# Patient Record
Sex: Male | Born: 1980
Health system: Southern US, Community
[De-identification: ages and names within clinical notes are randomized; demographics above are authoritative.]

## PROBLEM LIST (undated history)

## (undated) DIAGNOSIS — I471 Supraventricular tachycardia, unspecified: Secondary | ICD-10-CM

## (undated) HISTORY — PX: LEG SURGERY: SHX1003

## (undated) HISTORY — PX: STOMACH SURGERY: SHX791

---

## 2001-10-26 ENCOUNTER — Emergency Department (HOSPITAL_COMMUNITY): Admission: EM | Admit: 2001-10-26 | Discharge: 2001-10-26 | Payer: Self-pay | Admitting: Emergency Medicine

## 2001-10-26 ENCOUNTER — Encounter: Payer: Self-pay | Admitting: Emergency Medicine

## 2002-04-05 ENCOUNTER — Emergency Department (HOSPITAL_COMMUNITY): Admission: EM | Admit: 2002-04-05 | Discharge: 2002-04-05 | Payer: Self-pay | Admitting: Emergency Medicine

## 2003-07-07 ENCOUNTER — Ambulatory Visit: Admission: RE | Admit: 2003-07-07 | Discharge: 2003-07-07 | Payer: Self-pay | Admitting: Family Medicine

## 2003-07-07 ENCOUNTER — Encounter: Payer: Self-pay | Admitting: Cardiology

## 2003-11-13 ENCOUNTER — Emergency Department (HOSPITAL_COMMUNITY): Admission: EM | Admit: 2003-11-13 | Discharge: 2003-11-13 | Payer: Self-pay | Admitting: Emergency Medicine

## 2003-11-19 ENCOUNTER — Emergency Department (HOSPITAL_COMMUNITY): Admission: EM | Admit: 2003-11-19 | Discharge: 2003-11-19 | Payer: Self-pay | Admitting: Emergency Medicine

## 2004-05-31 ENCOUNTER — Ambulatory Visit: Payer: Self-pay | Admitting: Internal Medicine

## 2004-07-24 ENCOUNTER — Ambulatory Visit: Payer: Self-pay | Admitting: Internal Medicine

## 2004-09-04 ENCOUNTER — Ambulatory Visit: Payer: Self-pay | Admitting: Internal Medicine

## 2005-05-05 HISTORY — PX: ELECTROPHYSIOLOGIC STUDY: SHX172A

## 2005-05-09 ENCOUNTER — Ambulatory Visit: Payer: Self-pay | Admitting: Cardiology

## 2005-05-10 ENCOUNTER — Inpatient Hospital Stay (HOSPITAL_COMMUNITY): Admission: EM | Admit: 2005-05-10 | Discharge: 2005-05-14 | Payer: Self-pay | Admitting: Emergency Medicine

## 2005-05-12 ENCOUNTER — Encounter: Payer: Self-pay | Admitting: Cardiology

## 2005-06-18 ENCOUNTER — Ambulatory Visit: Payer: Self-pay | Admitting: Internal Medicine

## 2005-07-21 ENCOUNTER — Ambulatory Visit: Payer: Self-pay | Admitting: Internal Medicine

## 2005-07-22 ENCOUNTER — Ambulatory Visit: Payer: Self-pay | Admitting: Cardiology

## 2005-10-09 ENCOUNTER — Ambulatory Visit: Payer: Self-pay | Admitting: Internal Medicine

## 2005-11-06 ENCOUNTER — Emergency Department (HOSPITAL_COMMUNITY): Admission: EM | Admit: 2005-11-06 | Discharge: 2005-11-07 | Payer: Self-pay | Admitting: Emergency Medicine

## 2006-10-15 ENCOUNTER — Ambulatory Visit: Payer: Self-pay | Admitting: Internal Medicine

## 2007-01-18 ENCOUNTER — Inpatient Hospital Stay (HOSPITAL_COMMUNITY): Admission: EM | Admit: 2007-01-18 | Discharge: 2007-01-19 | Payer: Self-pay | Admitting: Emergency Medicine

## 2007-01-18 ENCOUNTER — Ambulatory Visit: Payer: Self-pay | Admitting: Cardiology

## 2007-02-23 ENCOUNTER — Ambulatory Visit: Payer: Self-pay | Admitting: Internal Medicine

## 2007-10-25 ENCOUNTER — Ambulatory Visit: Payer: Self-pay | Admitting: Internal Medicine

## 2008-10-11 ENCOUNTER — Encounter (INDEPENDENT_AMBULATORY_CARE_PROVIDER_SITE_OTHER): Payer: Self-pay | Admitting: *Deleted

## 2008-11-20 ENCOUNTER — Telehealth: Payer: Self-pay | Admitting: Internal Medicine

## 2008-11-23 DIAGNOSIS — I498 Other specified cardiac arrhythmias: Secondary | ICD-10-CM

## 2008-11-23 DIAGNOSIS — E785 Hyperlipidemia, unspecified: Secondary | ICD-10-CM | POA: Insufficient documentation

## 2008-11-23 DIAGNOSIS — I471 Supraventricular tachycardia, unspecified: Secondary | ICD-10-CM | POA: Insufficient documentation

## 2008-11-23 DIAGNOSIS — R55 Syncope and collapse: Secondary | ICD-10-CM | POA: Insufficient documentation

## 2008-11-23 DIAGNOSIS — J45909 Unspecified asthma, uncomplicated: Secondary | ICD-10-CM

## 2008-11-24 ENCOUNTER — Ambulatory Visit: Payer: Self-pay | Admitting: Internal Medicine

## 2008-11-24 ENCOUNTER — Telehealth: Payer: Self-pay | Admitting: Internal Medicine

## 2010-05-14 ENCOUNTER — Encounter
Admission: RE | Admit: 2010-05-14 | Discharge: 2010-05-14 | Payer: Self-pay | Source: Home / Self Care | Attending: Family Medicine | Admitting: Family Medicine

## 2010-05-25 ENCOUNTER — Encounter: Payer: Self-pay | Admitting: Family Medicine

## 2010-05-28 ENCOUNTER — Encounter
Admission: RE | Admit: 2010-05-28 | Discharge: 2010-05-28 | Payer: Self-pay | Source: Home / Self Care | Attending: Family Medicine | Admitting: Family Medicine

## 2010-09-17 NOTE — Discharge Summary (Signed)
NAMEJUANITO, GONYER NO.:  [id number]   MEDICAL RECORD NO.:  [id number]          PATIENT TYPE:  INP   LOCATION:  3705                         FACILITY:  [facility]   PHYSICIAN:  Doylene Canning. Ladona Ridgel, MD    DATE OF BIRTH:  1980-09-02   DATE OF ADMISSION:  01/17/2007  DATE OF DISCHARGE:  01/19/2007                               DISCHARGE SUMMARY   ALLERGIES:  No known drug allergies.   DISCHARGING TIME:  Greater than 35 minutes.   FINAL DIAGNOSES:  1. Admitted with syncope in setting of recurrent intra-nodal reentry      accessory pathway tachy palpitation.  2. History of tachy palpitations.  Electrophysiology study January      2007 (accessory pathway origin HIS bundle both AV-VA dissociation:      No ablation secondary to intimacy of accessory pathway with AV      node).   SECONDARY DIAGNOSES:  1. Reentry tachycardia failed beta blockade, Rythmol, now on      flecainide. (Flecainide increased to 100 mg b.i.d. this admission.)  2. Echocardiogram:  Ejection fraction 55-65%.  3. Troponin I studies negative so far.  4. Asthma.  5. Dyslipidemia.   PROCEDURES:  No procedures this admission. His flecainide, which was 50  mg b.i.d., has been increased to 100 mg b.i.d.  The patient has no  further tachy arrhythmias.   BRIEF HISTORY:  Mr. Trompeter is a 30 year old male.  He has a history of  nodal fascicular tachycardia. He presents with one episode of syncope  and palpitation.   The patient has been treated for SVT with beta blockers, but they  failed.  Then he had a trial with Rythmol but subsequently achieved good  repression of the arrhythmia with flecainide.   In the last 12 hours, the patient has complained of headache and some  mild upper respiratory illness-type symptoms. In the evening while lying  down, the patient noticed acute exacerbation of palpitations with heart  rate ranging over 200 beats a minute.  He stood up and attempted to walk  down the hall in his  house and subsequently lost consciousness.  The  patient had no significant trauma to the head. The patient immediately  awoke on falling to the floor and noted spontaneous resolution of the  tachycardia.  He presented to the emergency room for further management.  The patient did not have chest pain and did not have any exertional  dyspnea.  No orthopnea, no paroxysmal nocturnal dyspnea.   The plan is to admit the patient with recurrence of supraventricular  tachycardia. Cardiac enzymes will be cycled to rule out acute myocardial  infarction.  He will continue on his home medications which includes  inhalers for asthma, and flecainide will be increased to 100 mg twice  daily.   HOSPITAL COURSE:  The patient presents on September 15 with an episode  of syncope and palpitation. The patient's dose of flecainide was  increased to 100 mg b.i.d.  He is had no recurrence and discharging  hospital day #2, September 16, on the increased dose.  His medications  now  include:  1. Flecainide 100 mg twice daily.  2. Flovent 110 mcg per inhalation 2 puffs daily.  3. Qvar inhaler 1 puff daily.  4. Albuterol inhaler as needed.   He is to call Dr. Lubertha Basque office at [phone] for an appointment in 3-4  weeks.   LABORATORY STUDIES:  Complete blood count September 15:  White cells  6.7, hemoglobin 14.1, hematocrit 42.5, platelets 164.  Serum  electrolytes:  Sodium 139, potassium 4.1, chloride 108, carbonate 26,  glucose 99, BUN 13, creatinine 1.16.  Troponin I studies are 0.01 and  then 0.02.  TSH is 1.283.      [name], PA      Doylene Canning. Ladona Ridgel, MD  Electronically Signed    GM/MEDQ  D:  01/19/2007  T:  01/19/2007  Job:  [id number]

## 2010-09-17 NOTE — H&P (Signed)
NAMEAARIN, Xavier [name] NO.:  [id number]   MEDICAL RECORD NO.:  [id number]          PATIENT TYPE:  EMS   LOCATION:  MAJO                         FACILITY:  [facility]   PHYSICIAN:  Reginia Forts, MD     DATE OF BIRTH:  Sep 15, 1980   DATE OF ADMISSION:  01/17/2007  DATE OF DISCHARGE:                              HISTORY & PHYSICAL   CHIEF COMPLAINT:  Syncope.   Mr. Gowdy is a 30 year old African-American male with a history of nodal  fascicular tachycardia, who presents with one episode of syncope and  palpitations.  The patient has historically been treated for his  presumed SVT with beta-blockers, but failed that initially.  He was  subsequently treated with flecainide, which has controlled his  palpitations for the last year and a half.  He underwent one EP study on  May 13, 2005, demonstrating nodal fascicular tachycardia originating  from the septum with both VA and AV dissociation.  Because of the  location of this presumed accessory pathway too close to the node, the  patient was not a good candidate at this time for accessory pathway  ablation.  The patient has been doing well with his medical regimen  until tonight.  For the last 12 hours, the patient has complained of  headache with some mild upper respiratory illness-type symptoms.  This  evening while laying down, the patient noticed acute exacerbation of his  palpitations with heart rate ranging over 200 beats per minute.  The  patient stood up and attempted to walk down the hall and subsequently  lost consciousness.  There was no significant trauma to the head per the  patient's wife.  The patient awoke immediately upon falling on the  floor, and had noted spontaneous resolution of the tachycardia.  He  subsequently presented to the emergency room for further management.  He  did note chest pain during the SVT, but denied any exertional chest  pain, orthopnea, or paroxysmal nocturnal dyspnea.   PAST  MEDICAL HISTORY:  1. History of syncope.  2. SVT with a nodal fascicular origin that has failed beta-blocker.  3. History of premature birth.  4. Asthma.  5. Hyperlipidemia.   ALLERGIES:  No known drug allergies.   MEDICATIONS:  1. Albuterol 2 puffs b.i.d.  2. Flovent 2 puffs in the morning.  3. QVAR p.r.n.  4. Flecainide 50 mg p.o. b.i.d.   SOCIAL HISTORY:  The patient lives in [place].  He is married with 2  children.  He is a Merchandiser, retail at [employer].  Denies any alcohol, drugs or  tobacco.   FAMILY HISTORY:  Maternal grandmother and grandfather with coronary  artery disease.  There is no family history of arrhythmia.   REVIEW OF SYSTEMS:  The rest of 12 review of systems was reviewed and is  negative.   PHYSICAL EXAMINATION:  Temperature is afebrile, pulse 63, respiratory  rate 16, blood pressure 114/62.  GENERAL:  The patient is awake, alert, oriented x3, in no acute  distress.  HEENT:  Normocephalic, atraumatic.  The pupils are equal, round,  reactive to light.  Extraocular  movements are intact.  NECK:  No JVD.  No carotid bruits.  CARDIOVASCULAR:  Regular rhythm.  Normal rate.  No murmurs, rubs, or  gallops.  ABDOMEN:  Positive bowel sounds.  Soft, nontender, nondistended.  EXTREMITIES:  No cyanosis, clubbing, or edema.  MUSCULOSKELETAL:  No joint deformities or effusions.  NEURO:  Cranial nerves II-XII grossly intact.  No focal musculoskeletal  or sensory deficits.  PSYCH:  Normal affect.   EKG, no sinus bradycardia with a first degree AV block with a rate of 56  beats per minute, and incomplete right bundle branch block.   LABORATORY DATA:  White count 7.3, hemoglobin 14.1, BUN 14, creatinine  1.14, potassium 3.7.  CK 49, MB is less than 1.   ASSESSMENT AND PLAN:  This is a 30 year old African-American male with  nodal fascicular tachycardia, now with a breakthrough episode causing  syncope.   1. Supraventricular tachycardia.  The patient will be admitted to  a      monitored floor and continued on flecainide until further      discussion can occur regarding dosing of flecainide versus other      pharmacological modalities.  2. Chest pain.  The patient will be ruled out for myocardial      infarction with serial enzymes.  Chest pain is likely due to the      severe tachycardia.  3. Asthma.  The patient will be continued on his home medications.      The patient has not increased the dosing of albuterol.  There is no      evidence to suggest that albuterol exacerbated his current      symptoms.      Reginia Forts, MD  Electronically Signed     RA/MEDQ  D:  01/18/2007  T:  01/18/2007  Job:  [id number]

## 2010-09-17 NOTE — Assessment & Plan Note (Signed)
[facility]                         ELECTROPHYSIOLOGY OFFICE NOTE   LACHARLES, ALTSCHULER                       MRN:          [id number]  DATE:02/23/2007                            DOB:          [birth year]    Mr. Cherian returns today in followup.  He is a very pleasant young man  with a history of syncope in the setting of history of SVT versus VT who  has been on flecainide therapy for over a year.  He has preserved LV  function.  He returns today for followup.  I saw the patient in the  hospital approximately 1 month ago.  At that time, he had another  syncopal episode in the setting of vomiting, nausea,  and palpitations.  He was monitored, and no additional arrhythmias and discharged home.  Otherwise stable.  He has had no specific problems since discharge from  the hospital and returns today for followup.  He denies chest pain and  denies shortness of breath.  He has gone back to work.  He has had no  recurrent syncope.   MEDICATIONS:  1. Flecainide 100 mg twice daily.  2. Multiple inhalers.   PHYSICAL EXAMINATION:  GENERAL:  He is a pleasant, well-appearing young  man in no acute distress.  VITAL SIGNS:  Blood pressure today was 118/66, pulse 60 and regular.  NECK:  No jugular venous distention.  LUNGS:  Clear bilaterally to auscultation.  No wheezes, rales, or  rhonchi.  CARDIOVASCULAR:  Exam revealed a regular rate and rhythm with normal S1  and S2.  ABDOMEN:  Soft and nontender.  EXTREMITIES:  Demonstrated no cyanosis, clubbing, or edema.  Pulses were  2+ and symmetric.   EKG today demonstrates sinus bradycardia with first-degree AV block and  incomplete right bundle branch block.   IMPRESSION:  1. Symptomatic tachy palpitations.  2. Sinus bradycardia.  3. Incomplete right bundle branch block.   DISCUSSION:  Overall, Xavier [name] is stable and is maintaining sinus  rhythm on his flecainide therapy.  I will plan to see him back in the  office in several months.  He will continue flecainide.     Doylene Canning. Ladona Ridgel, MD  Electronically Signed    GWT/MedQ  DD: 02/23/2007  DT: 02/24/2007  Job #: [id number]

## 2010-09-17 NOTE — Assessment & Plan Note (Signed)
[facility]                         ELECTROPHYSIOLOGY OFFICE NOTE   WAYLYN, TENBRINK                       MRN:          [id number]  DATE:10/25/2007                            DOB:          1980-10-08    [name] returned today in followup.  He is a very pleasant young male  with a history of a wide QRS tachycardia who we have had on SVT versus  VT.  We had on flecainide therapy now for over a year.  He notes that he  has been out of his medications.  He has occasional palpitations and  dizziness more so when he is off his meds.  The patient denies chest  pain or shortness of breath other than when his heart races.  He had no  other specific complaints today.   Medications include flecainide 100 mg twice daily which he is not  presently on, Flovent b.i.d., and Tylenol p.r.n.   PHYSICAL EXAMINATION:  GENERAL:  He is a pleasant well-appearing young  man in no distress.  VITAL SIGNS:  Blood pressure was 118/73, the pulse was 65 and regular,  the respirations were 18, and the weight was 157 pounds.  NECK:  No jugular venous distention.  LUNGS:  Clear bilaterally to auscultation.  No wheezes, rales, or  rhonchi are present.  CARDIOVASCULAR:  Regular rate and rhythm.  Normal S1 and split S2.  No  murmurs were present.  EXTREMITIES:  Demonstrated no cyanosis, clubbing, or edema.   EKG demonstrated sinus rhythm with first-degree AV block and incomplete  right bundle-branch block.   IMPRESSION:  1. Probable supraventricular tachycardia.  2. Chronic flecainide therapy.  3. Medical noncompliance.   DISCUSSION:  Overall, Xavier [name] is stable.  I have given him a new  prescription for flecainide and asked that he take it twice a day.  I  will see him back in the office in 1 year or sooner should he have  worsening symptoms of palpitations or heart racing, syncope, or chest  pain.     Xavier [name]. Ladona Ridgel, MD  Electronically Signed    GWT/MedQ  DD:  10/25/2007  DT: 10/26/2007  Job #: [id number]

## 2010-09-17 NOTE — Assessment & Plan Note (Signed)
[facility]                         ELECTROPHYSIOLOGY OFFICE NOTE   KHYLAN, SAWYER                       MRN:          [id number]  DATE:10/15/2006                            DOB:          1980-05-16    Mr. Nylen returns today for followup after a year of absence from our EP  clinic.  He is a very pleasant 30 year old male with a history of  syncope and documented SVT versus VT who underwent electrophysiologic  study with the findings of a tachycardia originating out of the septum  with both VA as well as AV dissociation of his tachycardia.  He  initially tried Rythmol after failing beta blockers, but then was placed  on flecainide and has really done very nicely in the last year in terms  of arrhythmia control on flecainide.  He initially has some problems  with nausea but this has all resolved.  He denies chest pain, he denies  shortness of breath, he denies recurrent palpitations.  He does admit to  being off of his medications for several weeks, as he ran out and did  not have refills.   PHYSICAL EXAMINATION:  GENERAL:  He is a pleasant, well-appearing young  man in no distress.  VITAL SIGNS:  The blood pressure was 124/76, the pulse 66 and regular,  the respirations were 18, the weight was 155 pounds.  NECK:  Revealed no jugular venous distention.  LUNGS:  Clear bilaterally to auscultation.  There were no wheezes,  rales, or rhonchi.  CARDIOVASCULAR:  Reveals a regular rate and rhythm with normal S1, S2.  EXTREMITIES:  Demonstrated no edema.   EKG demonstrates sinus rhythm with incomplete right bundle-branch block.   IMPRESSION:  1. History of syncope.  2. Documented tachycardia originating from the AV junction (etiology      still not completely certain) with nice suppression on flecainide.   DISCUSSION:  Overall, Mr. Eberlin is stable.  Will continue his  flecainide.  Will see him back in a year.  He is instructed to call us  if he  has additional or recurrent heart racing.     Doylene Canning. Ladona Ridgel, MD  Electronically Signed    GWT/MedQ  DD: 10/15/2006  DT: 10/15/2006  Job #: [id number]

## 2010-09-20 NOTE — Discharge Summary (Signed)
Xavier [name] NO.:  [id number]   MEDICAL RECORD NO.:  [id number]          PATIENT TYPE:  INP   LOCATION:  2628                         FACILITY:  [facility]   PHYSICIAN:  Jesse Sans. Wall, M.D.   DATE OF BIRTH:  08-08-1980   DATE OF ADMISSION:  05/09/2005  DATE OF DISCHARGE:  05/14/2005                                 DISCHARGE SUMMARY   ALLERGIES:  NO KNOWN DRUG ALLERGIES.   PRINCIPAL DIAGNOSES:  1.  Admitted May 10, 2004, with dyspnea and palpitation.  Found to have      wide complex tachycardia with rates of 220 to 230.  2.  Electrophysiology study May 13, 2005.  Finding of intranodal reentry      with intermittent block, left-to-right pattern.  3.  Echocardiogram  May 12, 2005.  Ejection fraction 55 to 65%, left      ventricular function overall normal, mild dyssynergy of the      interventricular septum.  4.  CT chest negative for hilar or mediastinal adenopathy which would have      been consistent with sarcoid.  5.  On CT of the chest demonstrating large cystic mass in the left upper      quadrant closely associated under the surface of the spleen.  Follow-up      CT of the abdomen recommended.   SECONDARY DIAGNOSES:  1.  Dyslipidemia.  2.  Asthma.  3.  Cardiomegaly.   PROCEDURE:  May 13, 2005, electrophysiology study, Dr. Lewayne Bunting.  Study showed intranodal reentry pattern.  No radiofrequency catheter  ablation attempted.   BRIEF HISTORY:  Xavier [name] is a 30 year old male.  He has a history of  recurrent tachy palpitations in the past dating back 10 years.  He has never  had syncope.  On the day of admission, he developed recurrent sustained  tachy palpitation with dyspnea on electrocardiogram demonstrating wide QRS  tachycardia, rate of 230 beats per minute.  He was treated with amiodarone  and lidocaine and ultimately had termination of the tachycardia with  subsequent atrial fibrillation.  Patient ruled out for myocardial  infarction.  In the past, palpitation would occur with exercise,  particularly playing basketball and stop simultaneously.   PAST MEDICAL HISTORY:  Negative except for asthma, dyslipidemia.  He has  seen Dr. Sherene Sires for his asthma in the past.  He has been on bronchodilators  such as albuterol.   HOSPITAL COURSE:  Patient admitted through the emergency room with tachy  palpitations and wide complex tachycardia which has slowed through atrial  fibrillation to sinus bradycardia on amiodarone.  He has been maintained on  IV heparin throughout this hospitalization and electrophysiology study was  requested.  An echocardiogram  was done as well as a CT of the chest.  Electrophysiology was unable to firmly identify the patient's dysrhythmia  using electrocardiogram and suggested electrophysiology study.  The  echocardiogram  showed an ejection fraction preserved with mild dyssynergy  of the interventricular septum and mild mitral valve prolapse and trivial  mitral regurgitation.  Because there was some thought that the patient might  have sarcoid, the CT of the chest was obtained.  There was no evidence of  adenopathy either hilar or mediastinal to suggest this.  However, enlarged  cystic mass underlying the spleen was demonstrated and a follow-up CT scan  was recommended.  Patient went for electrophysiology study May 13, 2005.  The study showed an intra-AV nodal reentry pattern left-to-right with  intermittent block.  No radiofrequency catheter ablation was attempted.  Patient will discharge on beta-blocker possibly, if patient has recurrence,  Rythmol.  In addition, a follow-up CT of the abdomen will be scheduled when  he sees Dr. Ladona Ridgel in the office.   LABORATORY STUDIES:  This admission, serum electrolytes revealed sodium 136,  potassium 4.8, chloride 110, carbonate 23, BUN 13, creatinine 1.1, glucose  122.  Complete blood count revealed white cells 6, hemoglobin 13.8,  hematocrit 39.3,  platelets 162.  Lipid profile revealed cholesterol 252,  triglycerides 26, HDL cholesterol 76, LDL cholesterol 171.  TSH was 0.516.  Troponin I studies 0.20, then 0.17, then 0.10.  No outstanding laboratories.   DISCHARGE MEDICATIONS:  1.  Toprol XL 25 mg daily, this is a new medication.  2.  Patient is to continue albuterol inhaler as needed.  3.  Flovent 44 mcg for inhalation one puff twice daily.  4.  Lipitor 10 mg daily at bedtime.   Patient is to avoid over strenuous physical activity until he sees Dr.  Ladona Ridgel in the office in follow-up.  He is also asked not to drive for the  next two days and to avoid heavy lifting for the next week.  He may shower.  He is to call [phone] if he experiences pain, swelling, or bleeding at the  cath site.  He will see Dr. Ladona Ridgel at [facility] at [address] on Wednesday, June 18, 2005, at 12:15 in the afternoon.      Xavier [name], P.A.      Thomas C. Wall, M.D.  Electronically Signed    GM/MEDQ  D:  05/14/2005  T:  05/14/2005  Job:  [id number]   cc:   Doylene Canning. Ladona Ridgel, M.D.  [address]  [address]  [place]  [place] [address]

## 2010-09-20 NOTE — H&P (Signed)
NAMESAYER, MASINI NO.:  [id number]   MEDICAL RECORD NO.:  [id number]          PATIENT TYPE:  INP   LOCATION:  1823                         FACILITY:  [facility]   PHYSICIAN:  Jesse Sans. Wall, M.D.   DATE OF BIRTH:  01-Mar-1981   DATE OF ADMISSION:  05/09/2005  DATE OF DISCHARGE:                                HISTORY & PHYSICAL   CHIEF COMPLAINT:  The patient is brought to the emergency room with  shortness of breath and palpitations, having been found by EMS to be in a  wide complex tachycardia at a rate of about 220 to 230 beats per minute.   HISTORY OF PRESENT ILLNESS:  [name] is a very pleasant 30 year old  married black male from [place] who has a history of premature birth,  according to his wife.  He was only about 20 weeks.  Apparently, he has had  an enlarged heart and has had life-long asthma.  He has never seen a  cardiologist.   He is able to play basketball and had played earlier today for about two  hours.  Other than occasional exercise-induced wheezing, he does well.  He  is followed by Dr. Sandrea Hughs of pulmonary division.   This evening after coming into the house at about 10:30, he noticed just  feeling weak and short of breath.  He then felt his heart racing and he  fell, but did not loose consciousness.  EMS was called by his wife.  Upon  arrival, his 12-lead EKG showed what appeared to be atrial fibrillation with  frequent ventricular ectopy and short runs of wide complex tachycardia.  He  then spontaneously went into a wide complex tachycardia that was around 220  to 230 beats per minute.  His pressure was stable the entire time.  He was  given 90 mg of IV lidocaine without success.  Upon arrival here, he was  given Adenocard 6 mg and then 12 mg IV.  There was no effect.  Valsalva was  tried and his heart rate was slowed down, but then reverted right back to  the wide complex tachycardia.  When I arrived, he was taching away at  about  220 to 230 beats per minute, blood pressure was running about 100 systolic,  he was comfortable.   Anesthesia was called for back-up as well as respiratory.  Informed consent  was made for potential cardioversion.  He was then put through a series of  Valsalva maneuvers which converted him to what looks like atrial  fibrillation with a rate of about 60 to 80.  We then gave him IV Lopressor 5  mg, followed by an additional 5 mg.  He was bolused with 150 mg of IV  Amiodarone and is now on a drip.  His rhythm appears to be atrial  fibrillation with frequent PVC's.  He is stable at present and comfortable.   PAST MEDICAL HISTORY:  1.  He has been told in the past that his heart beat was irregular.  He has      never seen a cardiologist.  He  has been told he has an enlarged heart.      His functional status has been quite good.  2.  Hyperlipidemia, being treated with Lipitor by Dr. Sherene Sires.  3.  History of asthma, being treated by Dr. Sherene Sires.   He is in the process of changing primary care physician's because of  insurance change with his wife.   ALLERGIES:  No known drug allergies.   MEDICATIONS AT HOME:  1.  Dexamethasone 40 mcg one puff b.i.d.  2.  Albuterol p.r.n. inhaler.  3.  Pulmicort 200 mcg per dose one puff daily.  4.  Lipitor 10 mg daily he assumes.   HABITS:  He does not smoke, does not drink, and does not use any illicit  drugs.   FAMILY HISTORY:  Really noncontributory.   SOCIAL HISTORY:  He is a Merchandiser, retail at [employer].  His wife and he have two  children, one five months and one 62 years old.  They live in [place].   REVIEW OF SYSTEMS:  Other than the HPI is negative.  He has no history of  bleeding or any bleeding disorder.   PHYSICAL EXAMINATION:  GENERAL:  He is in no acute distress.  He is very  pleasant.  SKIN:  Warm and dry.  VITAL SIGNS:  His blood pressure is now 101/70, his heart rate is 66 and  irregular, respiratory rate is 18, he is afebrile.   HEENT:  Sclerae clear.  Extraocular movements intact.  PERRLA.  Facial  symmetry is normal.  Dentition is fair.  NECK:  Carotid upstrokes are equal bilaterally without bruits.  No JVD.  Thyroid is not enlarged.  Trachea is midline.  LUNGS:  Clear.  HEART:  A displaced PMI.  He has no gallop at present.  There is a systolic  murmur along the left sternal border.  ABDOMEN:  Soft with good bowel sounds.  There are no midline bruits, no  hepatomegaly.  EXTREMITIES:  No cyanosis, clubbing, or edema.  Pulses are brisk.  NEUROLOGIC:  Intact.   LABORATORY DATA:  A 12-lead EKG now demonstrates atrial fibrillation with a  well controlled ventricular rate.  He has frequent PVC's.   Chest x-ray is pending.  Laboratory data is pending.   ASSESSMENT:  1.  Probable supraventricular tachycardia with wide complex QRS with a rate      dependent left bundle branch block pattern.  2.  History of irregular heart beat.  3.  Enlarged heart by history, but no cardiology evaluation.  4.  Hyperlipidemia.  5.  Asthma.  6.  Premature delivery as an infant.   PLAN:  1.  Admit to the coronary care unit.  2.  Continue Amiodarone intravenously and beta blockers in the form of      Lopressor p.o.  3.  Check CPK with MB's, comprehensive metabolic panel, magnesium, TSH.  We      will check fasting lipids.  4.  Continue pulmonary medications.  5.  2-D echocardiogram.  6.  EP consult.  7.  Intravenous heparin per pharmacy.      Thomas C. Wall, M.D.  Electronically Signed     TCW/MEDQ  D:  05/10/2005  T:  05/10/2005  Job:  [id number]

## 2010-09-20 NOTE — Op Note (Signed)
Xavier [name], DIETZE NO.:  [id number]   MEDICAL RECORD NO.:  [id number]          PATIENT TYPE:  INP   LOCATION:  2628                         FACILITY:  [facility]   PHYSICIAN:  Doylene Canning. Ladona Ridgel, M.D.  DATE OF BIRTH:  [birth year]   DATE OF PROCEDURE:  05/13/2005  DATE OF DISCHARGE:                                 OPERATIVE REPORT   PROCEDURE PERFORMED:  Electrophysiologic study utilizing Isuprel.   INTRODUCTION:  The patient is a 30 year old young male with a 10-year  history of tachy palpitations was admitted to hospital with a wide QRS  tachycardia. He was subsequently found to have preserved LV function and  normal RV function. CT scan demonstrated no evidence of sarcoid. He is now  referred for electrophysiologic study and possible catheter ablation.   PROCEDURE:  After informed consent was obtained, the patient is taken to the  diagnostic EP lab in fasting state. After usual preparation and draping,  intravenous fentanyl and Midazolam was given for sedation. A 6-French  quadripolar catheter was inserted percutaneously in the right femoral vein  and advanced to the RV apex. 5-French quadripolar catheter was inserted  percutaneously in the right femoral vein advanced the His bundle region. A 5-  French quadripolar catheter was inserted percutaneously in the right femoral  vein and advanced to the right atrium. After measurement of basic intervals,  rapid ventricular pacing was carried out from the RV apex demonstrating VA  dissociation. Programmed ventricular stimulation was carried out from the RV  apex also demonstrating VA dissociation. Rapid atrial pacing was carried out  from the high right atrium at base drive cycle length of 045 milliseconds.  The S1-S2 interval was stepwise decreased down to 520 milliseconds where AV  Wenckebach was observed. During rapid atrial pacing the PR interval was less  than the RR interval. There is no inducible SVT. Next programmed  atrial  stimulation was carried out the high right atrium at base drive cycle length  of 409 milliseconds. The S1-S2 interval was stepwise decreased from 540  milliseconds down to 420 milliseconds with AV node ERP was observed. During  probing stimulation there no AH jumps and no echo beats. At this point  Isuprel was infused at 2 mcg per minute. Additional rapid atrial, rapid  ventricular, and programmed atrial and ventricular stimulation, S1-S2-S3-S4  coupling interval at 500/220/220/210. There was inducible tachycardia. This  was a narrow QRS tachycardia. Interestingly enough, despite its narrow  complex there was intermittent VA dissociation as well as a V dissociation.  The tachycardia cycle length varied from 270-300 milliseconds. The atrial  activation appeared to be earliest in the His bundle region.  During  tachycardia, ventricular pacing demonstrated the VAV conduction sequence.  Atrial pacing would often demonstrated AV dissociation. The tachycardia  would terminate spontaneously. With all the above findings a nodal Hisian  type and no ablation therapy was delivered. The catheters were removed.  Hemostasis was assured, the patient was returned to his room in satisfactory  condition.   COMPLICATIONS:  There were no immediate procedure complications.   RESULTS:  A.  Baseline  ECG.  Baseline ECG demonstrates normal sinus rhythm  with __________ intervals.  The HV interval was 71 milliseconds. The sinus  node cycle length was 1000 milliseconds. The QRS duration was 90  milliseconds.  C. Initially demonstrated VA dissociation at 600 milliseconds. Following  Isuprel infusion rapid ventricular pacing still demonstrated VA  dissociation.  __________ RV apex had a base drive cycle length of 161  milliseconds as well as 500 milliseconds. S2-S2, S1-S2-S3, and S1-S2-S3-S4  stimuli were delivered with S1-S2, S2-S3, S3-S4 interval stepwise decreased  down to ventricular refractoriness.  During programmed ventricular  stimulation from the RV apex at base drive cycle length of 096/045/409/811  there was inducible narrow QRS  high right atrium, paced cycle length of 600 milliseconds stepwise decreased  down to 520 milliseconds where AV Wenckebach was observed. During rapid  atrial pacing the PR interval was less than the RR interval.  F.  Programmed atrial stimulation. Programmed atrial stimulation was carried  out from the high right atrium base drive cycle length of 914 milliseconds.  The S1-S2 interval stepwise decreased from 540 milliseconds down to 420  milliseconds where the AV node ERP was observed. During programmed atrial  stimulation there were no AH jumps and no echo beats noted. Programmed  ventricular stimulation on Isuprel duration was sustained.  Termination was  spontaneous. Cycle length varied from 270 to __________.   CONCLUSION:  This study demonstrates an apparent unusual accessory pathway  (nodal fascicular which demonstrated evidence of both AV as well as VA  dissociation during tachycardia. Because of the tachycardia's proximity to  the AV node, no ablation therapy was delivered. The plan will be to try the  patient on beta blockers plus or minus Rythmol.           ______________________________  Doylene Canning. Ladona Ridgel, M.D.     GWT/MEDQ  D:  05/13/2005  T:  05/14/2005  Job:  [id number]   cc:   Charlaine Dalton. Sherene Sires, M.D. [facility]  [address]  [place]  [place] [address]

## 2010-09-20 NOTE — Consult Note (Signed)
Xavier [name], [name] NO.:  [id number]   MEDICAL RECORD NO.:  [id number]          PATIENT TYPE:  INP   LOCATION:  2628                         FACILITY:  [facility]   PHYSICIAN:  Doylene Canning. Ladona Ridgel, M.D.  DATE OF BIRTH:  Mar 05, 1981   DATE OF CONSULTATION:  05/12/2005  DATE OF DISCHARGE:                                   CONSULTATION   REQUESTING PHYSICIAN:  Dr. Juanito Doom.   INTRODUCTION:  The patient is a 30 year old young male with a history of  recurrent tachy-palpitations in the past, perhaps as long as 10 years.  He  has never had syncope.  This patient was in his usual state of health until  the day of admission, when he developed recurrent sustained tachy-  palpitations and was found to have a wide QRS tachycardia at a rate of 230  beats per minute.  He was admitted to the hospital, where he was treated  with amiodarone and lidocaine, and ultimately had termination of his  tachycardia with atrial fibrillation to follow.  The patient ruled out for  MI.  A CT scan of the chest did not demonstrate sarcoid.  The patient denies  a history of frank syncope.  In the past his palpitations would occur with  exercise, particularly with playing basketball, and stop spontaneously.  His  past medical history is otherwise negative except for asthma, and he has  seen Dr. Sherene Sires for this in the past.  He has been on bronchodilators like  albuterol.   SOCIAL HISTORY:  The patient is married.  He denies tobacco or ethanol use.   FAMILY HISTORY:  His family history is negative for tachy-palpitations,  syncope or sudden death.   REVIEW OF SYSTEMS:  His review of systems is negative for vision or hearing  problems, nausea, vomiting, diarrhea or constipation, polyuria or  polydipsia, heart or cold intolerance, recent weight changes, skin problems,  arthritic complaints or symptoms, except for palpitations, as previously  noted.  The rest of his review of systems was reviewed and found  to be  negative.   PHYSICAL EXAMINATION:  GENERAL:  He is a pleasant, well-appearing young man  in no distress.  VITAL SIGNS:  The blood pressure was 100/60, the pulse 64 and regular, the  respirations were 20, temperature was 98, oxygen saturation 98%.  HEENT:  Normocephalic and atraumatic.  The pupils are equal and round.  The  oropharynx is moist.  The sclerae are anicteric.  NECK:  The neck revealed no jugular venous distention.  There was no  thyromegaly.  The trachea was midline.  LUNGS:  The lungs were clear bilaterally to auscultation.  There were no  wheezes, rales or rhonchi.  CARDIOVASCULAR:  Exam revealed a regular rate and rhythm with a normal S1  and S2.  There were no murmurs, rubs, or gallops and the PMI was not  laterally displaced.  ABDOMEN:  Soft, nontender and non-distended.  There was no organomegaly  present.  EXTREMITIES:  Extremities demonstrate no cyanosis, clubbing or edema.  The  pulses were 2+ and symmetric.  SKIN:  Exam  was normal.  MUSCULOSKELETAL:  The joint exam was normal.  NEUROLOGIC:  Alert and oriented x3 with cranial nerves intact.  The strength  was 5/5 and symmetric.   ACCESSORY CLINICAL DATA:  EKG demonstrates sinus bradycardia with first-  degree A-V block.   IMPRESSION:  Symptomatic wide QRS tachycardia.   DISCUSSION:  The mechanism of the patient's tachycardia is unclear.  He  could have an accessory pathway.  He could have RV outflow tract VT or some  other type of right ventricular tachycardia in the setting of normal LV  systolic function.  I have discussed the risks, benefits, goals and  expectations with the patient.  I have recommended electrophysiologic study  and possible catheter ablation.  The patient has agreed and I will plan to  schedule this as early as possible at a convenient time.           ______________________________  Doylene Canning. Ladona Ridgel, M.D.     GWT/MEDQ  D:  05/13/2005  T:  05/14/2005  Job:  [id number]

## 2011-02-13 LAB — CBC
Hemoglobin: 14.1
MCHC: 33.2
RBC: 4.82

## 2011-02-13 LAB — CK TOTAL AND CKMB (NOT AT ARMC): CK, MB: 1

## 2011-02-13 LAB — CARDIAC PANEL(CRET KIN+CKTOT+MB+TROPI)
Relative Index: 0.6
Total CK: 144
Troponin I: 0.01

## 2011-02-13 LAB — BASIC METABOLIC PANEL
CO2: 26
Calcium: 9.3
GFR calc Af Amer: >60
Potassium: 4.1
Sodium: 139

## 2011-02-14 LAB — CBC
RBC: 4.76
WBC: 7.3

## 2011-02-14 LAB — BASIC METABOLIC PANEL
Calcium: 9.5
Chloride: 106
Creatinine, Ser: 1.14
GFR calc Af Amer: >60

## 2011-02-14 LAB — POCT CARDIAC MARKERS
Myoglobin, poc: 49
Operator id: 133351
Troponin i, poc: <0.05

## 2012-02-11 ENCOUNTER — Encounter: Payer: Self-pay | Admitting: Internal Medicine

## 2012-03-02 ENCOUNTER — Ambulatory Visit (INDEPENDENT_AMBULATORY_CARE_PROVIDER_SITE_OTHER): Payer: BC Managed Care – PPO | Admitting: Internal Medicine

## 2012-03-02 ENCOUNTER — Encounter: Payer: Self-pay | Admitting: Internal Medicine

## 2012-03-02 VITALS — BP 129/76 | HR 73 | Ht 69.0 in | Wt 160.0 lb

## 2012-03-02 DIAGNOSIS — I4949 Other premature depolarization: Secondary | ICD-10-CM

## 2012-03-02 DIAGNOSIS — I493 Ventricular premature depolarization: Secondary | ICD-10-CM

## 2012-03-02 DIAGNOSIS — I498 Other specified cardiac arrhythmias: Secondary | ICD-10-CM

## 2012-03-02 MED ORDER — FLECAINIDE ACETATE 100 MG PO TABS: 100.0000 mg | ORAL_TABLET | Freq: Two times a day (BID) | ORAL | Status: DC

## 2012-03-02 NOTE — Patient Instructions (Signed)
Your physician wants you to follow-up in 6 months with Dr Court Joy will receive a reminder letter in the mail two months in advance. If you don't receive a letter, please call our office to schedule the follow-up appointment.   Your physician has recommended you make the following change in your medication: 1) Start taking Flecainide 100mg  twice daily

## 2012-03-02 NOTE — Progress Notes (Signed)
HPI Mr. Bobst returns today for followup. He is a very pleasant 31 year old man with a history of tachycardia palpitations, and documented tachycardia. His initial presentation demonstrated a right bundle branch block tachycardia several years ago. EP testing demonstrated what was thought to be a fascicular nodal tachycardia. No ablation was attempted out of concerns for complete heart block. Several weeks ago, the patient had a recurrent episode lasting approximately 5 or 10 minutes. It was associated dizziness but no frank syncope. He has been off of his arrhythmia drugs for several years. He has been for the most part asymptomatic otherwise. No Known Allergies   Current Outpatient Prescriptions  Medication Sig Dispense Refill  . ADVAIR DISKUS 100-50 MCG/DOSE AEPB Twice daily.      Marland Kitchen PROAIR HFA 108 (90 BASE) MCG/ACT inhaler as needed.      . flecainide (TAMBOCOR) 100 MG tablet Take 1 tablet (100 mg total) by mouth 2 (two) times daily.  60 tablet  3     No past medical history on file.  ROS:   All systems reviewed and negative except as noted in the HPI.   Past Surgical History  Procedure Date  . Stomach surgery   . Leg surgery     broken leg     Family History  Problem Relation Age of Onset  . Heart disease Maternal Grandmother      History   Social History  . Marital Status: Married    Spouse Name: N/A    Number of Children: 3  . 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 Smoking status: Never Smoker   . Smokeless tobacco: Not on file  . Alcohol Use: No  . Drug Use: No  . Sexually Active: Not on file   Other Topics Concern  . Not on file   Social History Narrative  . No narrative on file     BP 129/76  Pulse 73  Ht 5\' 9"  (1.753 m)  Wt 160 lb (72.576 kg)  BMI 23.63 kg/m2  SpO2 98%  Physical Exam:  Well appearing 31 year old man, NAD HEENT: Unremarkable Neck:  No JVD, no  thyromegally Lungs:  Clear with no wheezes, rales, or rhonchi. HEART:  Regular rate rhythm, no murmurs, no rubs, no clicks Abd:  soft, positive bowel sounds, no organomegally, no rebound, no guarding Ext:  2 plus pulses, no edema, no cyanosis, no clubbing Skin:  No rashes no nodules Neuro:  CN II through XII intact, motor grossly intact  EKG Normal sinus rhythm normal axis and intervals. Assess/Plan:

## 2012-03-02 NOTE — Assessment & Plan Note (Signed)
The etiology of his symptoms is likely due to his SVT. We will try to control these with flecainide therapy. He'll take 1-2 tablets daily as needed to control his palpitations. If his symptoms increase in frequency and severity, he is instructed to call us and we would recommend daily antiarrhythmic drug therapy.

## 2012-06-19 ENCOUNTER — Other Ambulatory Visit: Payer: Self-pay

## 2012-10-26 ENCOUNTER — Other Ambulatory Visit: Payer: Self-pay | Admitting: Internal Medicine

## 2012-12-08 ENCOUNTER — Encounter: Payer: Self-pay | Admitting: Obstetrics

## 2012-12-08 LAB — US OB DETAIL + 14 WK

## 2013-01-17 ENCOUNTER — Encounter: Payer: Self-pay | Admitting: Internal Medicine

## 2013-01-18 MED ORDER — FLECAINIDE ACETATE 100 MG PO TABS: ORAL_TABLET | ORAL | Status: DC

## 2013-03-09 ENCOUNTER — Telehealth: Payer: Self-pay | Admitting: Internal Medicine

## 2013-03-09 NOTE — Telephone Encounter (Signed)
New Problem  Xavier [name] from [employer] [place] [employer] has Faxed a  medical records release form and request a call back to confirm that it was received.Marland Kitchen

## 2013-03-10 ENCOUNTER — Other Ambulatory Visit: Payer: Self-pay

## 2016-01-18 ENCOUNTER — Telehealth: Payer: Self-pay | Admitting: Internal Medicine

## 2016-01-18 ENCOUNTER — Encounter: Payer: Self-pay | Admitting: Internal Medicine

## 2016-01-18 NOTE — Telephone Encounter (Signed)
Called pt and left message asking pt to call back to update his Fm and medical Hx and to get previous information concerning his previous provider.

## 2016-01-21 ENCOUNTER — Ambulatory Visit (INDEPENDENT_AMBULATORY_CARE_PROVIDER_SITE_OTHER): Payer: BLUE CROSS/BLUE SHIELD | Admitting: Internal Medicine

## 2016-01-21 ENCOUNTER — Encounter: Payer: Self-pay | Admitting: Internal Medicine

## 2016-01-21 VITALS — BP 128/84 | HR 58 | Ht 68.0 in | Wt 155.8 lb

## 2016-01-21 DIAGNOSIS — I471 Supraventricular tachycardia: Secondary | ICD-10-CM | POA: Diagnosis not present

## 2016-01-21 MED ORDER — FLECAINIDE ACETATE 100 MG PO TABS: ORAL_TABLET | ORAL | 11 refills | Status: DC

## 2016-01-21 NOTE — Progress Notes (Signed)
HPI Mr. Ostergren returns today for followup after a long absence from our arrhythmia clinic. He has a h/o unusual ventricular pre-excitation which was demonstrated to be a nodofascicular pathway at EP testing. He had done very well on flecainide in the past but had stopped taking his flecainide about 6 months ago. He admits that his palpitations have returned. He will feel symptoms about once a week. No syncope.  No Known Allergies   Current Outpatient Prescriptions  Medication Sig Dispense Refill  . ADVAIR DISKUS 100-50 MCG/DOSE AEPB Twice daily.    . flecainide (TAMBOCOR) 100 MG tablet TAKE 1 TABLET (100 MG TOTAL) BY MOUTH 2 (TWO) TIMES DAILY. 60 tablet 11  . PROAIR HFA 108 (90 BASE) MCG/ACT inhaler as needed.     No current facility-administered medications for this visit.      No past medical history on file.  ROS:   All systems reviewed and negative except as noted in the HPI.   Past Surgical History:  Procedure Laterality Date  . LEG SURGERY     broken leg  . STOMACH SURGERY       Family History  Problem Relation Age of Onset  . Heart disease Maternal Grandmother      Social History   Social History  . Marital status: Married    Spouse name: N/A  . Number of children: 3  . 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 Smoking status: Never Smoker  . Smokeless tobacco: Never Used  . Alcohol use No  . Drug use: No  . Sexual activity: Not on file   Other Topics Concern  . Not on file   Social History Narrative  . No narrative on file     BP 128/84   Pulse (!) 58   Ht 5\' 8"  (1.727 m)   Wt 155 lb 12.8 oz (70.7 kg)   BMI 23.69 kg/m   Physical Exam:  Well appearing 35 year old man, NAD HEENT: Unremarkable Neck:  6 cm JVD, no thyromegally Lungs:  Clear with no wheezes, rales, or rhonchi. HEART:  Regular rate rhythm, no murmurs, no rubs, no clicks Abd:  soft, positive bowel sounds, no  organomegally, no rebound, no guarding Ext:  2 plus pulses, no edema, no cyanosis, no clubbing Skin:  No rashes no nodules Neuro:  CN II through XII intact, motor grossly intact  EKG Normal sinus rhythm normal axis and intervals. IRBBB\  Assess/Plan:  1. Palpitations/SVT - he has had recurrent symptoms after stopping his meds. Will restart flecainide and have him return for an ECG in a couple of weeks. 2. Asthma - he will continue as needed albuterol. If he has more symptoms he will consider switching to Xopenex.  Leonia Reeves.D.

## 2016-01-21 NOTE — Patient Instructions (Addendum)
Medication Instructions:    Your physician has recommended you make the following change in your medication:   1) RESTART Flecainide 100 mg twice daily  --- If you need a refill on your cardiac medications before your next appointment, please call your pharmacy. ---  Labwork:  None ordered  Testing/Procedures:  None ordered  Follow-Up:  Your physician recommends that you schedule a follow-up appointment in: 2 weeks for a nurse visit EKG  (on a day Dr. Ladona Ridgel is in the office).   Your physician wants you to follow-up in: 1 year with Dr. Ladona Ridgel.  You will receive a reminder letter in the mail two months in advance. If you don't receive a letter, please call our office to schedule the follow-up appointment.  Thank you for choosing [facility]!!

## 2016-02-06 ENCOUNTER — Ambulatory Visit (INDEPENDENT_AMBULATORY_CARE_PROVIDER_SITE_OTHER): Payer: BLUE CROSS/BLUE SHIELD | Admitting: *Deleted

## 2016-02-06 VITALS — BP 112/74 | HR 58 | Ht 68.0 in | Wt 155.5 lb

## 2016-02-06 DIAGNOSIS — I471 Supraventricular tachycardia: Secondary | ICD-10-CM | POA: Diagnosis not present

## 2016-02-06 NOTE — Patient Instructions (Signed)
Medication Instructions:   Your physician recommends that you continue on your current medications as directed. Please refer to the Current Medication list given to you today.   Labwork: None   Testing/Procedures: None  Follow-Up: Your physician wants you to follow-up in: September 2018 with Dr Ladona Ridgel. You will receive a reminder letter in the mail two months in advance. If you don't receive a letter, please call our office to schedule the follow-up appointment.     If you need a refill on your cardiac medications before your next appointment, please call your pharmacy.

## 2016-02-06 NOTE — Progress Notes (Signed)
1.) Reason for visit: EKG  2.) Name of MD requesting visit: Dr Ladona Ridgel  3.) H&P: Copied from Dr Lubertha Basque 01/21/16 office note:            HPI Xavier [name] returns today for followup after a long absence from our arrhythmia clinic. He has a h/o unusual ventricular pre-excitation which was demonstrated to be a nodofascicular pathway at EP testing. He had done very well on flecainide in the past but had stopped taking his flecainide about 6 months ago. He admits that his palpitations have returned. He will feel symptoms about once a week. No syncope.  No Known Allergies  4.) ROS related to problem:                Pt states he is tolerating flecainide without symptoms.               Pt states palpitations have improved after starting flecainide.  5.) Assessment and plan per MD:               EKG reviewed by Dr Ladona Ridgel             Per Dr Ladona Ridgel:              EKG looks good, no changes.

## 2017-01-29 ENCOUNTER — Other Ambulatory Visit: Payer: Self-pay | Admitting: Internal Medicine

## 2017-02-10 ENCOUNTER — Ambulatory Visit (INDEPENDENT_AMBULATORY_CARE_PROVIDER_SITE_OTHER): Payer: BLUE CROSS/BLUE SHIELD | Admitting: Internal Medicine

## 2017-02-10 ENCOUNTER — Encounter: Payer: Self-pay | Admitting: Internal Medicine

## 2017-02-10 VITALS — BP 112/72 | HR 52 | Ht 68.0 in | Wt 156.8 lb

## 2017-02-10 DIAGNOSIS — R002 Palpitations: Secondary | ICD-10-CM

## 2017-02-10 DIAGNOSIS — I471 Supraventricular tachycardia: Secondary | ICD-10-CM | POA: Diagnosis not present

## 2017-02-10 MED ORDER — FLECAINIDE ACETATE 100 MG PO TABS: ORAL_TABLET | ORAL | 11 refills | Status: DC

## 2017-02-10 NOTE — Progress Notes (Signed)
      HPI Xavier [name] returns today after a one year absence from our arrhythmia clinic. He is a very pleasant 36 year old man with a history of palpitations and remote history of very unusual preexcitation pattern with EP study demonstrating a nodofascicular tachycardia. The patient had done well on flecainide for several years. In the interim, and over the last several months, he is noted intermittent episodes of palpitations associated with dizziness and lightheadedness. He has not had syncope. He denies medical noncompliance. He returns today for additional evaluation. He denies chest pain or shortness of breath. He remains active at work.  No Known Allergies   Current Outpatient Prescriptions  Medication Sig Dispense Refill  . ADVAIR DISKUS 100-50 MCG/DOSE AEPB Inhale 1 puff into the lungs Twice daily.     . flecainide (TAMBOCOR) 100 MG tablet TAKE 1 TABLET (100 MG TOTAL) BY MOUTH 2 (TWO) TIMES DAILY. 60 tablet 11  . PROAIR HFA 108 (90 BASE) MCG/ACT inhaler Inhale into the lungs as needed for wheezing or shortness of breath (Use as directed).      No current facility-administered medications for this visit.      No past medical history on file.  ROS:   All systems reviewed and negative except as noted in the HPI.   Past Surgical History:  Procedure Laterality Date  . LEG SURGERY     broken leg  . STOMACH SURGERY       Family History  Problem Relation Age of Onset  . Heart disease Maternal Grandmother      Social History   Social History  . Marital status: Married    Spouse name: N/A  . Number of children: 3  . 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 Smoking status: Never Smoker  . Smokeless tobacco: Never Used  . Alcohol use No  . Drug use: No  . Sexual activity: Not on file   Other Topics Concern  . Not on file   Social History Narrative  . No narrative on file     BP 112/72   Pulse  (!) 52   Ht 5\' 8"  (1.727 m)   Wt 156 lb 12.8 oz (71.1 kg)   SpO2 99%   BMI 23.84 kg/m   Physical Exam:  Well appearing 36 year old man, NAD HEENT: Unremarkable Neck:  No JVD, no thyromegally Lymphatics:  No adenopathy Back:  No CVA tenderness Lungs:  Clear, with no wheezes, rales, or rhonchi HEART:  Regular rate rhythm, no murmurs, no rubs, no clicks Abd:  soft, positive bowel sounds, no organomegally, no rebound, no guarding Ext:  2 plus pulses, no edema, no cyanosis, no clubbing Skin:  No rashes no nodules Neuro:  CN II through XII intact, motor grossly intact  EKG - normal sinus rhythm with no ventricular preexcitation  Assess/Plan: 1. Palpitations - the mechanism of his symptoms is unclear. I've asked the patient to wear heart monitor for 4 weeks. I will see him back based on the results of his heart monitor. He is encouraged to continue his current medications. 2. Asthma - he will continue his bronchodilators as needed.  Xavier [name], M.D.

## 2017-02-10 NOTE — Patient Instructions (Addendum)
Medication Instructions:  Your physician recommends that you continue on your current medications as directed. Please refer to the Current Medication list given to you today.  Labwork: None ordered.  Testing/Procedures: Your physician has recommended that you wear an event monitor. Event monitors are medical devices that record the heart's electrical activity. Doctors most often [place] these monitors to diagnose arrhythmias. Arrhythmias are problems with the speed or rhythm of the heartbeat. The monitor is a small, portable device. You can wear one while you do your normal daily activities. This is usually used to diagnose what is causing palpitations/syncope (passing out).  Please schedule for a 4 week event monitor for palpitations.  Follow-Up:  Your physician wants you to follow-up in:  2 weeks with Dr. Ladona Ridgel after your event monitor is complete.  Any Other Special Instructions Will Be Listed Below (If Applicable).   If you need a refill on your cardiac medications before your next appointment, please call your pharmacy.

## 2017-02-25 ENCOUNTER — Ambulatory Visit (INDEPENDENT_AMBULATORY_CARE_PROVIDER_SITE_OTHER): Payer: BLUE CROSS/BLUE SHIELD

## 2017-02-25 DIAGNOSIS — R002 Palpitations: Secondary | ICD-10-CM | POA: Diagnosis not present

## 2017-03-03 ENCOUNTER — Encounter: Payer: Self-pay | Admitting: Internal Medicine

## 2017-03-23 ENCOUNTER — Encounter: Payer: Self-pay | Admitting: Internal Medicine

## 2017-03-25 ENCOUNTER — Telehealth: Payer: Self-pay

## 2017-03-25 NOTE — Telephone Encounter (Signed)
Caryn Bee with [facility] calling and states that the patient's monitor showed that he was in SVT 210 bpm at 2:55 PM CT and ended 3:03 PM CT. He states that he already contacted the patient who reports that during that time he was doing his normal daily activities and was asymptomatic. He states that the patient converted to ST 110 and remains asymptomatic. Caryn Bee states that he is going to send over the report.

## 2017-03-25 NOTE — Telephone Encounter (Signed)
Report still has not been uploaded to the website and fax has not been received. Made Dr. Ladona Ridgel and his RN aware. Dr. Ladona Ridgel is okay with no changes at this time since patient is asymptomatic.

## 2017-04-14 ENCOUNTER — Encounter: Payer: Self-pay | Admitting: Internal Medicine

## 2017-04-15 ENCOUNTER — Ambulatory Visit: Payer: BLUE CROSS/BLUE SHIELD | Admitting: Internal Medicine

## 2017-04-27 ENCOUNTER — Encounter: Payer: Self-pay | Admitting: Internal Medicine

## 2017-04-29 ENCOUNTER — Encounter: Payer: Self-pay | Admitting: Internal Medicine

## 2017-07-03 ENCOUNTER — Ambulatory Visit: Payer: BLUE CROSS/BLUE SHIELD | Admitting: Internal Medicine

## 2017-07-09 ENCOUNTER — Ambulatory Visit: Payer: BLUE CROSS/BLUE SHIELD | Admitting: Internal Medicine

## 2017-09-30 ENCOUNTER — Ambulatory Visit: Payer: BLUE CROSS/BLUE SHIELD | Admitting: Internal Medicine

## 2017-09-30 ENCOUNTER — Encounter: Payer: Self-pay | Admitting: Internal Medicine

## 2017-09-30 VITALS — BP 114/82 | HR 68 | Ht 69.0 in | Wt 155.0 lb

## 2017-09-30 DIAGNOSIS — I471 Supraventricular tachycardia: Secondary | ICD-10-CM | POA: Diagnosis not present

## 2017-09-30 DIAGNOSIS — R002 Palpitations: Secondary | ICD-10-CM

## 2017-09-30 NOTE — Patient Instructions (Addendum)
Medication Instructions:  Your physician recommends that you continue on your current medications as directed. Please refer to the Current Medication list given to you today.  Labwork: None ordered.  Testing/Procedures: Your physician has recommended that you have an ablation. Catheter ablation is a medical procedure used to treat some cardiac arrhythmias (irregular heartbeats). During catheter ablation, a long, thin, flexible tube is put into a blood vessel in your groin (upper thigh), or neck. This tube is called an ablation catheter. It is then guided to your heart through the blood vessel. Radio frequency waves destroy small areas of heart tissue where abnormal heartbeats may cause an arrhythmia to start. Please see the instruction sheet given to you today.  Follow-Up: Your physician wants you to follow-up in: 4 months with Dr. Ladona Ridgel.    Any Other Special Instructions Will Be Listed Below (If Applicable).  If you need a refill on your cardiac medications before your next appointment, please call your pharmacy.  Ablation days: June 3, 26 July 8, 10, 11, 15, 16, 18 and 29  If you decide to go ahead with the procedure please call me: Ancil Boozer, RN with Dr. Ladona Ridgel [phone]

## 2017-09-30 NOTE — Progress Notes (Signed)
HPI [name] returns today for followup. He is a pleasant 37 yo man with a very unusual AP, who underwent ablation years ago. At that time he remained persistently inducible. He has been treated with medical therapy for almost 10 years. He has been reasonably well controlled. Over the last couple of months, his symptoms have increased in frequency and severity. He denies missing any of his meds. The patient had a syncope episodes associate with palpitations. He had no other complaints beside palpitations. No Known Allergies   Current Outpatient Medications  Medication Sig Dispense Refill  . ADVAIR DISKUS 100-50 MCG/DOSE AEPB Inhale 1 puff into the lungs Twice daily.     . flecainide (TAMBOCOR) 100 MG tablet TAKE 1 TABLET (100 MG TOTAL) BY MOUTH 2 (TWO) TIMES DAILY. 60 tablet 11  . PROAIR HFA 108 (90 BASE) MCG/ACT inhaler Inhale into the lungs as needed for wheezing or shortness of breath (Use as directed).      No current facility-administered medications for this visit.      No past medical history on file.  ROS:   All systems reviewed and negative except as noted in the HPI.   Past Surgical History:  Procedure Laterality Date  . LEG SURGERY     broken leg  . STOMACH SURGERY       Family History  Problem Relation Age of Onset  . Heart disease Maternal Grandmother      Social History   Socioeconomic History  . Marital status: Married    Spouse name: Not on file  . Number of children: 3  . Years of education: Not on file  . Highest education level: Not on file  Occupational History  . 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 Financial resource strain: Not on file  . Food insecurity:    Worry: Not on file    Inability: Not on file  . Transportation needs:    Medical: Not on file    Non-medical: Not on file  Tobacco Use  . Smoking status: Never Smoker  . Smokeless tobacco: Never Used  Substance and Sexual Activity  . Alcohol use: No  . Drug  use: No  . Sexual activity: Not on file  Lifestyle  . Physical activity:    Days per week: Not on file    Minutes per session: Not on file  . Stress: Not on file  Relationships  . Social connections:    Talks on phone: Not on file    Gets together: Not on file    Attends religious service: Not on file    Active member of club or organization: Not on file    Attends meetings of clubs or organizations: Not on file    Relationship status: Not on file  . Intimate partner violence:    Fear of current or ex partner: Not on file    Emotionally abused: Not on file    Physically abused: Not on file    Forced sexual activity: Not on file  Other Topics Concern  . Not on file  Social History Narrative  . Not on file     BP 114/82   Pulse 68   Ht 5\' 9"  (1.753 m)   Wt 155 lb (70.3 kg)   SpO2 97%   BMI 22.89 kg/m   Physical Exam:  Well appearing 37 yo man, NAD HEENT: Unremarkable Neck:  No JVD, no thyromegally Lymphatics:  No adenopathy Back:  No  CVA tenderness Lungs:  Clear with no wheezes HEART:  Regular rate rhythm, no murmurs, no rubs, no clicks Abd:  soft, positive bowel sounds, no organomegally, no rebound, no guarding Ext:  2 plus pulses, no edema, no cyanosis, no clubbing Skin:  No rashes no nodules Neuro:  CN II through XII intact, motor grossly intact  EKG - NSR with PVC   Assess/Plan: 1. SVT - his symptoms have worsened. I have recommended repeat EP study and ablation with CARTO. I have reviewed the risks/benfefits/goals/expectations of ablation. I specifically mentioned the possibility of developing CHB and needing a PPM. He will call us if he wishes to proceed. 2. Asthma - no worsening at this point. We will follow.  Leonia Reeves.D.

## 2017-10-01 ENCOUNTER — Encounter: Payer: Self-pay | Admitting: Internal Medicine

## 2018-01-28 ENCOUNTER — Ambulatory Visit: Payer: BLUE CROSS/BLUE SHIELD | Admitting: Internal Medicine

## 2018-02-22 DIAGNOSIS — R0602 Shortness of breath: Secondary | ICD-10-CM | POA: Diagnosis not present

## 2018-02-22 DIAGNOSIS — R072 Precordial pain: Secondary | ICD-10-CM | POA: Diagnosis not present

## 2018-02-22 DIAGNOSIS — R001 Bradycardia, unspecified: Secondary | ICD-10-CM | POA: Diagnosis not present

## 2018-02-22 DIAGNOSIS — R079 Chest pain, unspecified: Secondary | ICD-10-CM | POA: Diagnosis not present

## 2018-02-22 DIAGNOSIS — R55 Syncope and collapse: Secondary | ICD-10-CM | POA: Diagnosis not present

## 2018-02-22 DIAGNOSIS — R0789 Other chest pain: Secondary | ICD-10-CM | POA: Diagnosis not present

## 2018-02-22 DIAGNOSIS — I491 Atrial premature depolarization: Secondary | ICD-10-CM | POA: Diagnosis not present

## 2018-02-22 DIAGNOSIS — I499 Cardiac arrhythmia, unspecified: Secondary | ICD-10-CM | POA: Diagnosis not present

## 2018-02-22 DIAGNOSIS — Z5321 Procedure and treatment not carried out due to patient leaving prior to being seen by health care provider: Secondary | ICD-10-CM | POA: Diagnosis not present

## 2018-02-22 DIAGNOSIS — R002 Palpitations: Secondary | ICD-10-CM | POA: Diagnosis not present

## 2018-02-23 DIAGNOSIS — R55 Syncope and collapse: Secondary | ICD-10-CM | POA: Diagnosis not present

## 2018-02-23 DIAGNOSIS — R072 Precordial pain: Secondary | ICD-10-CM | POA: Diagnosis not present

## 2018-02-23 DIAGNOSIS — I499 Cardiac arrhythmia, unspecified: Secondary | ICD-10-CM | POA: Diagnosis not present

## 2018-02-23 DIAGNOSIS — R0789 Other chest pain: Secondary | ICD-10-CM | POA: Diagnosis not present

## 2018-02-23 DIAGNOSIS — Z5329 Procedure and treatment not carried out because of patient's decision for other reasons: Secondary | ICD-10-CM | POA: Diagnosis not present

## 2018-02-23 DIAGNOSIS — Z8679 Personal history of other diseases of the circulatory system: Secondary | ICD-10-CM | POA: Diagnosis not present

## 2018-02-23 DIAGNOSIS — Z5321 Procedure and treatment not carried out due to patient leaving prior to being seen by health care provider: Secondary | ICD-10-CM | POA: Diagnosis not present

## 2018-02-23 DIAGNOSIS — I4510 Unspecified right bundle-branch block: Secondary | ICD-10-CM | POA: Diagnosis not present

## 2018-02-23 DIAGNOSIS — R001 Bradycardia, unspecified: Secondary | ICD-10-CM | POA: Diagnosis not present

## 2018-02-23 DIAGNOSIS — I440 Atrioventricular block, first degree: Secondary | ICD-10-CM | POA: Diagnosis not present

## 2018-02-25 DIAGNOSIS — G4700 Insomnia, unspecified: Secondary | ICD-10-CM | POA: Diagnosis not present

## 2018-02-25 DIAGNOSIS — F419 Anxiety disorder, unspecified: Secondary | ICD-10-CM | POA: Diagnosis not present

## 2018-02-25 DIAGNOSIS — I471 Supraventricular tachycardia: Secondary | ICD-10-CM | POA: Diagnosis not present

## 2018-02-25 DIAGNOSIS — J4530 Mild persistent asthma, uncomplicated: Secondary | ICD-10-CM | POA: Diagnosis not present

## 2018-03-03 ENCOUNTER — Encounter: Payer: Self-pay | Admitting: Internal Medicine

## 2018-03-03 ENCOUNTER — Ambulatory Visit: Payer: BLUE CROSS/BLUE SHIELD | Admitting: Internal Medicine

## 2018-03-03 VITALS — BP 130/86 | HR 61 | Ht 68.0 in | Wt 153.4 lb

## 2018-03-03 DIAGNOSIS — I471 Supraventricular tachycardia: Secondary | ICD-10-CM

## 2018-03-03 DIAGNOSIS — R002 Palpitations: Secondary | ICD-10-CM | POA: Diagnosis not present

## 2018-03-03 LAB — CBC WITH DIFFERENTIAL/PLATELET
BASOS: 1 %
Basophils Absolute: 0.1 10*3/uL (ref 0.0–0.2)
EOS (ABSOLUTE): 0.2 10*3/uL (ref 0.0–0.4)
EOS: 5 %
HEMATOCRIT: 43.6 % (ref 37.5–51.0)
HEMOGLOBIN: 14.3 g/dL (ref 13.0–17.7)
IMMATURE GRANS (ABS): 0 10*3/uL (ref 0.0–0.1)
IMMATURE GRANULOCYTES: 1 %
LYMPHS: 50 %
Lymphocytes Absolute: 2 10*3/uL (ref 0.7–3.1)
MCH: 30.2 pg (ref 26.6–33.0)
MCHC: 32.8 g/dL (ref 31.5–35.7)
MCV: 92 fL (ref 79–97)
Monocytes Absolute: 0.5 10*3/uL (ref 0.1–0.9)
Monocytes: 12 %
NEUTROS PCT: 31 %
Neutrophils Absolute: 1.2 10*3/uL — ABNORMAL LOW (ref 1.4–7.0)
Platelets: 157 10*3/uL (ref 150–450)
RBC: 4.74 x10E6/uL (ref 4.14–5.80)
RDW: 12.5 % (ref 12.3–15.4)
WBC: 4 10*3/uL (ref 3.4–10.8)

## 2018-03-03 LAB — BASIC METABOLIC PANEL
BUN/Creatinine Ratio: 14 (ref 9–20)
BUN: 17 mg/dL (ref 6–20)
CALCIUM: 9.2 mg/dL (ref 8.7–10.2)
CO2: 23 mmol/L (ref 20–29)
Chloride: 101 mmol/L (ref 96–106)
Creatinine, Ser: 1.25 mg/dL (ref 0.76–1.27)
GFR, EST AFRICAN AMERICAN: 84 mL/min/{1.73_m2} (ref >59)
GFR, EST NON AFRICAN AMERICAN: 73 mL/min/{1.73_m2} (ref >59)
Glucose: 91 mg/dL (ref 65–99)
POTASSIUM: 4.3 mmol/L (ref 3.5–5.2)
Sodium: 138 mmol/L (ref 134–144)

## 2018-03-03 NOTE — Progress Notes (Signed)
HPI Xavier [name] returns today for followup. He is a pleasant 37 yo man with a very unusual AP, who underwent ablation years ago. At that time he remained persistently inducible. He has been treated with medical therapy for almost 10 years. He has been reasonably well controlled. Over the last couple of months, his symptoms have increased in frequency and severity. He denies missing any of his meds. The patient had a syncope episodes associate with palpitations. He had no other complaints beside palpitations. When I saw him last several months ago, we discussed catheter ablation with the very distinct possibility of needing a PPM. However he was severely symptomatic, even experiencing syncope despite medical therapy with flecainide. In the interim, he has had more symptoms.  No Known Allergies   Current Outpatient Medications  Medication Sig Dispense Refill  . ADVAIR DISKUS 100-50 MCG/DOSE AEPB Inhale 1 puff into the lungs Twice daily.     Marland Kitchen atorvastatin (LIPITOR) 20 MG tablet Take 1 tablet by mouth daily.    . flecainide (TAMBOCOR) 100 MG tablet TAKE 1 TABLET (100 MG TOTAL) BY MOUTH 2 (TWO) TIMES DAILY. 60 tablet 11  . FLUoxetine (PROZAC) 20 MG capsule Take 1 capsule by mouth daily.  1  . HYDROcodone-ibuprofen (VICOPROFEN) 7.5-200 MG tablet Take 1 tablet by mouth every 8 (eight) hours.  0  . PROAIR HFA 108 (90 BASE) MCG/ACT inhaler Inhale into the lungs as needed for wheezing or shortness of breath (Use as directed).      No current facility-administered medications for this visit.      History reviewed. No pertinent past medical history.  ROS:   All systems reviewed and negative except as noted in the HPI.   Past Surgical History:  Procedure Laterality Date  . LEG SURGERY     broken leg  . STOMACH SURGERY       Family History  Problem Relation Age of Onset  . Heart disease Maternal Grandmother      Social History   Socioeconomic History  . Marital status: Married      Spouse name: Not on file  . Number of children: 3  . Years of education: Not on file  . Highest education level: Not on file  Occupational History  . 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 Financial resource strain: Not on file  . Food insecurity:    Worry: Not on file    Inability: Not on file  . Transportation needs:    Medical: Not on file    Non-medical: Not on file  Tobacco Use  . Smoking status: Never Smoker  . Smokeless tobacco: Never Used  Substance and Sexual Activity  . Alcohol use: No  . Drug use: No  . Sexual activity: Not on file  Lifestyle  . Physical activity:    Days per week: Not on file    Minutes per session: Not on file  . Stress: Not on file  Relationships  . Social connections:    Talks on phone: Not on file    Gets together: Not on file    Attends religious service: Not on file    Active member of club or organization: Not on file    Attends meetings of clubs or organizations: Not on file    Relationship status: Not on file  . Intimate partner violence:    Fear of current or ex partner: Not on file    Emotionally abused: Not on file  Physically abused: Not on file    Forced sexual activity: Not on file  Other Topics Concern  . Not on file  Social History Narrative  . Not on file     BP 130/86   Pulse 61   Ht 5\' 8"  (1.727 m)   Wt 153 lb 6.4 oz (69.6 kg)   SpO2 99%   BMI 23.32 kg/m   Physical Exam:  Well appearing young man, NAD HEENT: Unremarkable Neck:  6 cm JVD, no thyromegally Lymphatics:  No adenopathy Back:  No CVA tenderness Lungs:  Clear with no wheezes HEART:  Regular rate rhythm, no murmurs, no rubs, no clicks Abd:  soft, positive bowel sounds, no organomegally, no rebound, no guarding Ext:  2 plus pulses, no edema, no cyanosis, no clubbing Skin:  No rashes no nodules Neuro:  CN II through XII intact, motor grossly intact  EKG - none   Assess/Plan: 1. Recurrent SVT - the patient most likely has a  concealed parahisian AP causing his SVT. I have discussed the treatment options with the patient. The risks/benefits/goals/expectations of catheter ablation were reviewed. I specifically discussed the possibility of ending up with a PPM due to the location of his AP. He wishes to proceed. I would anticipate stopping his flecainide after the ablation.  I spent 30 minutes including 50% face to face time.   Leonia Reeves.D.

## 2018-03-03 NOTE — Patient Instructions (Addendum)
Medication Instructions:  Your physician recommends that you continue on your current medications as directed. Please refer to the Current Medication list given to you today.  Labwork: You will get lab work today:  BMP and CBC.  Testing/Procedures: Your physician has recommended that you have an ablation. Catheter ablation is a medical procedure used to treat some cardiac arrhythmias (irregular heartbeats). During catheter ablation, a long, thin, flexible tube is put into a blood vessel in your groin (upper thigh), or neck. This tube is called an ablation catheter. It is then guided to your heart through the blood vessel. Radio frequency waves destroy small areas of heart tissue where abnormal heartbeats may cause an arrhythmia to start. Please see the instruction sheet given to you today.  Follow-Up:  You will follow up with Dr. Ladona Ridgel 4 weeks after your procedure.   Ablation INSTRUCTIONS:  Please arrive at the [facility] main entrance of [facility] hospital at:  5:30 am on March 29, 2018  Do not eat or drink after midnight prior to procedure  Do NOT take YOUR FLECAINIDE for 2 days prior to your procedure.  Your last dose will be March 26, 2018 your evening dose   Do NOT take any medications the AM of your procedure  Plan for one night stay but you may be discharged after your procedure  You will need someone to drive you home at discharge  If you need a refill on your cardiac medications before your next appointment, please call your pharmacy.   Cardiac Ablation Cardiac ablation is a procedure to disable (ablate) a small amount of heart tissue in very specific places. The heart has many electrical connections. Sometimes these connections are abnormal and can cause the heart to beat very fast or irregularly. Ablating some of the problem areas can improve the heart rhythm or return it to normal. Ablation may be done for people who:  Have Wolff-Parkinson-White syndrome.  Have  fast heart rhythms (tachycardia).  Have taken medicines for an abnormal heart rhythm (arrhythmia) that were not effective or caused side effects.  Have a high-risk heartbeat that may be life-threatening.  During the procedure, a small incision is made in the neck or the groin, and a long, thin, flexible tube (catheter) is inserted into the incision and moved to the heart. Small devices (electrodes) on the tip of the catheter will send out electrical currents. A type of X-ray (fluoroscopy) will be used to help guide the catheter and to provide images of the heart. Tell a health care provider about:  Any allergies you have.  All medicines you are taking, including vitamins, herbs, eye drops, creams, and over-the-counter medicines.  Any problems you or family members have had with anesthetic medicines.  Any blood disorders you have.  Any surgeries you have had.  Any medical conditions you have, such as kidney failure.  Whether you are pregnant or may be pregnant. What are the risks? Generally, this is a safe procedure. However, problems may occur, including:  Infection.  Bruising and bleeding at the catheter insertion site.  Bleeding into the chest, especially into the sac that surrounds the heart. This is a serious complication.  Stroke or blood clots.  Damage to other structures or organs.  Allergic reaction to medicines or dyes.  Need for a permanent pacemaker if the normal electrical system is damaged. A pacemaker is a small computer that sends electrical signals to the heart and helps your heart beat normally.  The procedure not being  fully effective. This may not be recognized until months later. Repeat ablation procedures are sometimes required.  What happens before the procedure?  Follow instructions from your health care provider about eating or drinking restrictions.  Ask your health care provider about: ? Changing or stopping your regular medicines. This is  especially important if you are taking diabetes medicines or blood thinners. ? Taking medicines such as aspirin and ibuprofen. These medicines can thin your blood. Do not take these medicines before your procedure if your health care provider instructs you not to.  Plan to have someone take you home from the hospital or clinic.  If you will be going home right after the procedure, plan to have someone with you for 24 hours. What happens during the procedure?  To lower your risk of infection: ? Your health care team will wash or sanitize their hands. ? Your skin will be washed with soap. ? Hair may be removed from the incision area.  An IV tube will be inserted into one of your veins.  You will be given a medicine to help you relax (sedative).  The skin on your neck or groin will be numbed.  An incision will be made in your neck or your groin.  A needle will be inserted through the incision and into a large vein in your neck or groin.  A catheter will be inserted into the needle and moved to your heart.  Dye may be injected through the catheter to help your surgeon see the area of the heart that needs treatment.  Electrical currents will be sent from the catheter to ablate heart tissue in desired areas. There are three types of energy that may be used to ablate heart tissue: ? Heat (radiofrequency energy). ? Laser energy. ? Extreme cold (cryoablation).  When the necessary tissue has been ablated, the catheter will be removed.  Pressure will be held on the catheter insertion area to prevent excessive bleeding.  A bandage (dressing) will be placed over the catheter insertion area. The procedure may vary among health care providers and hospitals. What happens after the procedure?  Your blood pressure, heart rate, breathing rate, and blood oxygen level will be monitored until the medicines you were given have worn off.  Your catheter insertion area will be monitored for bleeding.  You will need to lie still for a few hours to ensure that you do not bleed from the catheter insertion area.  Do not drive for 24 hours or as long as directed by your health care provider. Summary  Cardiac ablation is a procedure to disable (ablate) a small amount of heart tissue in very specific places. Ablating some of the problem areas can improve the heart rhythm or return it to normal.  During the procedure, electrical currents will be sent from the catheter to ablate heart tissue in desired areas. This information is not intended to replace advice given to you by your health care provider. Make sure you discuss any questions you have with your health care provider. Document Released: 09/07/2008 Document Revised: 03/10/2016 Document Reviewed: 03/10/2016 Elsevier Interactive Patient Education  [employer].

## 2018-03-05 ENCOUNTER — Other Ambulatory Visit: Payer: Self-pay | Admitting: *Deleted

## 2018-03-05 ENCOUNTER — Other Ambulatory Visit: Payer: Self-pay | Admitting: Internal Medicine

## 2018-03-05 MED ORDER — FLECAINIDE ACETATE 100 MG PO TABS: ORAL_TABLET | ORAL | 11 refills | Status: DC

## 2018-03-29 ENCOUNTER — Encounter (HOSPITAL_COMMUNITY): Payer: Self-pay

## 2018-03-29 ENCOUNTER — Ambulatory Visit (HOSPITAL_COMMUNITY): Admit: 2018-03-29 | Payer: BLUE CROSS/BLUE SHIELD | Admitting: Internal Medicine

## 2018-03-29 SURGERY — SVT ABLATION

## 2018-03-31 ENCOUNTER — Ambulatory Visit: Payer: BLUE CROSS/BLUE SHIELD | Admitting: Internal Medicine

## 2018-04-07 ENCOUNTER — Ambulatory Visit: Payer: BLUE CROSS/BLUE SHIELD | Admitting: Internal Medicine

## 2018-05-27 ENCOUNTER — Encounter: Payer: Self-pay | Admitting: Internal Medicine

## 2018-06-15 ENCOUNTER — Ambulatory Visit: Payer: BLUE CROSS/BLUE SHIELD | Admitting: Internal Medicine

## 2018-11-09 DIAGNOSIS — Z6825 Body mass index (BMI) 25.0-25.9, adult: Secondary | ICD-10-CM | POA: Diagnosis not present

## 2018-11-09 DIAGNOSIS — Z209 Contact with and (suspected) exposure to unspecified communicable disease: Secondary | ICD-10-CM | POA: Diagnosis not present

## 2018-12-03 ENCOUNTER — Other Ambulatory Visit: Payer: Self-pay

## 2018-12-03 ENCOUNTER — Inpatient Hospital Stay (HOSPITAL_COMMUNITY)
Admission: AD | Admit: 2018-12-03 | Discharge: 2018-12-08 | DRG: 227 | Disposition: A | Discharge status: Home / Self Care | Payer: BC Managed Care – PPO | Source: Other Acute Inpatient Hospital | Attending: Internal Medicine | Admitting: Internal Medicine

## 2018-12-03 ENCOUNTER — Inpatient Hospital Stay (HOSPITAL_COMMUNITY): Payer: BC Managed Care – PPO

## 2018-12-03 DIAGNOSIS — Z20828 Contact with and (suspected) exposure to other viral communicable diseases: Secondary | ICD-10-CM | POA: Diagnosis present

## 2018-12-03 DIAGNOSIS — I498 Other specified cardiac arrhythmias: Secondary | ICD-10-CM | POA: Diagnosis not present

## 2018-12-03 DIAGNOSIS — I440 Atrioventricular block, first degree: Secondary | ICD-10-CM | POA: Diagnosis present

## 2018-12-03 DIAGNOSIS — R52 Pain, unspecified: Secondary | ICD-10-CM | POA: Diagnosis not present

## 2018-12-03 DIAGNOSIS — I499 Cardiac arrhythmia, unspecified: Secondary | ICD-10-CM | POA: Diagnosis not present

## 2018-12-03 DIAGNOSIS — R0789 Other chest pain: Secondary | ICD-10-CM | POA: Diagnosis not present

## 2018-12-03 DIAGNOSIS — N179 Acute kidney failure, unspecified: Secondary | ICD-10-CM | POA: Diagnosis present

## 2018-12-03 DIAGNOSIS — Z1159 Encounter for screening for other viral diseases: Secondary | ICD-10-CM | POA: Diagnosis not present

## 2018-12-03 DIAGNOSIS — I340 Nonrheumatic mitral (valve) insufficiency: Secondary | ICD-10-CM | POA: Diagnosis not present

## 2018-12-03 DIAGNOSIS — I471 Supraventricular tachycardia, unspecified: Secondary | ICD-10-CM

## 2018-12-03 DIAGNOSIS — I4892 Unspecified atrial flutter: Secondary | ICD-10-CM | POA: Diagnosis not present

## 2018-12-03 DIAGNOSIS — I472 Ventricular tachycardia: Principal | ICD-10-CM | POA: Diagnosis present

## 2018-12-03 DIAGNOSIS — Z4682 Encounter for fitting and adjustment of non-vascular catheter: Secondary | ICD-10-CM | POA: Diagnosis not present

## 2018-12-03 DIAGNOSIS — I447 Left bundle-branch block, unspecified: Secondary | ICD-10-CM | POA: Diagnosis present

## 2018-12-03 DIAGNOSIS — Z7951 Long term (current) use of inhaled steroids: Secondary | ICD-10-CM

## 2018-12-03 DIAGNOSIS — Z79899 Other long term (current) drug therapy: Secondary | ICD-10-CM | POA: Diagnosis not present

## 2018-12-03 DIAGNOSIS — R404 Transient alteration of awareness: Secondary | ICD-10-CM | POA: Diagnosis not present

## 2018-12-03 DIAGNOSIS — R079 Chest pain, unspecified: Secondary | ICD-10-CM | POA: Diagnosis not present

## 2018-12-03 DIAGNOSIS — I371 Nonrheumatic pulmonary valve insufficiency: Secondary | ICD-10-CM | POA: Diagnosis not present

## 2018-12-03 DIAGNOSIS — J45909 Unspecified asthma, uncomplicated: Secondary | ICD-10-CM | POA: Diagnosis not present

## 2018-12-03 DIAGNOSIS — Z8249 Family history of ischemic heart disease and other diseases of the circulatory system: Secondary | ICD-10-CM

## 2018-12-03 DIAGNOSIS — I509 Heart failure, unspecified: Secondary | ICD-10-CM | POA: Diagnosis not present

## 2018-12-03 DIAGNOSIS — Z95810 Presence of automatic (implantable) cardiac defibrillator: Secondary | ICD-10-CM

## 2018-12-03 DIAGNOSIS — E785 Hyperlipidemia, unspecified: Secondary | ICD-10-CM | POA: Diagnosis present

## 2018-12-03 HISTORY — DX: Supraventricular tachycardia, unspecified: I47.10

## 2018-12-03 HISTORY — DX: Supraventricular tachycardia: I47.1

## 2018-12-03 LAB — CBC WITH DIFFERENTIAL/PLATELET
Abs Immature Granulocytes: 0.02 10*3/uL (ref 0.00–0.07)
Basophils Absolute: 0 10*3/uL (ref 0.0–0.1)
Basophils Relative: 1 %
Eosinophils Absolute: 0.1 10*3/uL (ref 0.0–0.5)
Eosinophils Relative: 1 %
HCT: 43.1 % (ref 39.0–52.0)
Hemoglobin: 14 g/dL (ref 13.0–17.0)
Immature Granulocytes: 0 %
Lymphocytes Relative: 19 %
Lymphs Abs: 1.2 10*3/uL (ref 0.7–4.0)
MCH: 29.9 pg (ref 26.0–34.0)
MCHC: 32.5 g/dL (ref 30.0–36.0)
MCV: 92.1 fL (ref 80.0–100.0)
Monocytes Absolute: 0.5 10*3/uL (ref 0.1–1.0)
Monocytes Relative: 8 %
Neutro Abs: 4.5 10*3/uL (ref 1.7–7.7)
Neutrophils Relative %: 71 %
Platelets: 159 10*3/uL (ref 150–400)
RBC: 4.68 MIL/uL (ref 4.22–5.81)
RDW: 13.2 % (ref 11.5–15.5)
WBC: 6.3 10*3/uL (ref 4.0–10.5)
nRBC: 0 % (ref 0.0–0.2)

## 2018-12-03 LAB — COMPREHENSIVE METABOLIC PANEL
ALT: 27 U/L (ref 0–44)
AST: 29 U/L (ref 15–41)
Albumin: 3.7 g/dL (ref 3.5–5.0)
Alkaline Phosphatase: 41 U/L (ref 38–126)
Anion gap: 7 (ref 5–15)
BUN: 22 mg/dL — ABNORMAL HIGH (ref 6–20)
CO2: 20 mmol/L — ABNORMAL LOW (ref 22–32)
Calcium: 8.4 mg/dL — ABNORMAL LOW (ref 8.9–10.3)
Chloride: 110 mmol/L (ref 98–111)
Creatinine, Ser: 1.62 mg/dL — ABNORMAL HIGH (ref 0.61–1.24)
GFR calc Af Amer: >60 mL/min (ref >60)
GFR calc non Af Amer: 53 mL/min — ABNORMAL LOW (ref >60)
Glucose, Bld: 94 mg/dL (ref 70–99)
Potassium: 4.1 mmol/L (ref 3.5–5.1)
Sodium: 137 mmol/L (ref 135–145)
Total Bilirubin: 1.1 mg/dL (ref 0.3–1.2)
Total Protein: 6.6 g/dL (ref 6.5–8.1)

## 2018-12-03 LAB — MAGNESIUM: Magnesium: 2 mg/dL (ref 1.7–2.4)

## 2018-12-03 LAB — TSH: TSH: 0.604 u[IU]/mL (ref 0.350–4.500)

## 2018-12-03 LAB — TROPONIN I (HIGH SENSITIVITY): Troponin I (High Sensitivity): 12 ng/L (ref <18)

## 2018-12-03 LAB — PROTIME-INR
INR: 1.1 (ref 0.8–1.2)
Prothrombin Time: 14.1 seconds (ref 11.4–15.2)

## 2018-12-03 LAB — APTT: aPTT: 26 seconds (ref 24–36)

## 2018-12-03 LAB — BRAIN NATRIURETIC PEPTIDE: B Natriuretic Peptide: 98.8 pg/mL (ref 0.0–100.0)

## 2018-12-03 MED ORDER — ATORVASTATIN CALCIUM 10 MG PO TABS
20.0000 mg | ORAL_TABLET | Freq: Every day | ORAL | Status: DC
  Administered 2018-12-04 – 2018-12-08 (×5): 20 mg via ORAL
  Filled 2018-12-03 (×5): qty 2

## 2018-12-03 MED ORDER — MOMETASONE FURO-FORMOTEROL FUM 100-5 MCG/ACT IN AERO
2.0000 | INHALATION_SPRAY | Freq: Two times a day (BID) | RESPIRATORY_TRACT | Status: DC
  Administered 2018-12-04 – 2018-12-08 (×8): 2 via RESPIRATORY_TRACT
  Filled 2018-12-03: qty 8.8

## 2018-12-03 MED ORDER — SODIUM CHLORIDE 0.9 % IV SOLN
INTRAVENOUS | Status: DC
  Administered 2018-12-03: 1000 mL via INTRAVENOUS

## 2018-12-03 MED ORDER — FLECAINIDE ACETATE 50 MG PO TABS
100.0000 mg | ORAL_TABLET | Freq: Two times a day (BID) | ORAL | Status: DC
  Administered 2018-12-03: 100 mg via ORAL
  Filled 2018-12-03: qty 2

## 2018-12-03 MED ORDER — ALBUTEROL SULFATE HFA 108 (90 BASE) MCG/ACT IN AERS: 2.0000 | INHALATION_SPRAY | Freq: Four times a day (QID) | RESPIRATORY_TRACT | Status: DC | PRN

## 2018-12-03 MED ORDER — ORAL CARE MOUTH RINSE
15.0000 mL | Freq: Two times a day (BID) | OROMUCOSAL | Status: DC
  Administered 2018-12-03: 15 mL via OROMUCOSAL

## 2018-12-03 MED ORDER — HEPARIN SODIUM (PORCINE) 5000 UNIT/ML IJ SOLN
5000.0000 [IU] | Freq: Three times a day (TID) | INTRAMUSCULAR | Status: DC
  Administered 2018-12-03 – 2018-12-07 (×11): 5000 [IU] via SUBCUTANEOUS
  Filled 2018-12-03 (×11): qty 1

## 2018-12-03 MED ORDER — ALBUTEROL SULFATE (2.5 MG/3ML) 0.083% IN NEBU: 2.5000 mg | INHALATION_SOLUTION | Freq: Four times a day (QID) | RESPIRATORY_TRACT | Status: DC | PRN

## 2018-12-03 MED ORDER — ONDANSETRON HCL 4 MG/2ML IJ SOLN: 4.0000 mg | Freq: Four times a day (QID) | INTRAMUSCULAR | Status: DC | PRN

## 2018-12-03 MED ORDER — ACETAMINOPHEN 325 MG PO TABS
650.0000 mg | ORAL_TABLET | ORAL | Status: DC | PRN
  Administered 2018-12-05 – 2018-12-08 (×3): 650 mg via ORAL
  Filled 2018-12-03 (×3): qty 2

## 2018-12-03 MED ORDER — FLUOXETINE HCL 20 MG PO CAPS
20.0000 mg | ORAL_CAPSULE | Freq: Every day | ORAL | Status: DC
  Administered 2018-12-04 – 2018-12-08 (×5): 20 mg via ORAL
  Filled 2018-12-03 (×5): qty 1

## 2018-12-03 MED ORDER — CHLORHEXIDINE GLUCONATE CLOTH 2 % EX PADS
6.0000 | MEDICATED_PAD | Freq: Every day | CUTANEOUS | Status: DC
  Administered 2018-12-03 – 2018-12-06 (×4): 6 via TOPICAL

## 2018-12-03 NOTE — H&P (Addendum)
History & Physical    Patient ID: Xavier [name] MRN: [id number], DOB/AGE: 01/31/1981   Admit date: 12/03/2018   Primary Physician: Lin Landsman, MD Primary Cardiologist: No primary care provider on file.  Patient Profile    Xavier [name] is a 25 yom with known accessory pathway s/p ablation years ago, asthma who presents due to syncope.   Past Medical History    No past medical history on file.  Past Surgical History:  Procedure Laterality Date  . LEG SURGERY     broken leg  . STOMACH SURGERY       Allergies  No Known Allergies  History of Present Illness    Xavier [name] is a 35 yom with known accessory pathway s/p ablation years ago, asthma who presents due to syncope.   The patient reports that he was in his usual state of health until he started to feel dizzy while walking down some steps at work on the day of admission. He reports he subsequently became diaphoretic and a co-worker called his superior. He does not remember the next events but woke up in the back of the ambulance. Per outside records, the patient had an episode of syncope but regained consciousness prior to EMS arrival. When EMS arrived, he was found to be in V-tach on the monitor. He then became diaphoretic and was again unresponsive. He was shocked twice (200J and 300J) and given 150mg  IV amiodarone. He was taken to [facility] where labs showed Cr elevated at 1.7, K 3.8, Mg 1.8, normal CBC, BNP 157 and undetectable troponin. He was subsequently transferred here for further care.  Notably, the patient has been followed closely by EP for known parahisian accessory pathway leading to SVT. He had increasing symptoms in the fall and plan was for ablation and possible PPM given location of pathway per note from EP on 03/03/18. The patient reports that he was going to proceed with the ablation but had to reschedule his appointment and then was delayed further in the setting of COVID. He has continued to take  his flecainide without interruption in the interim. On our discussion at the time of his arrival, he reports feeling well but is having some mild chest discomfort which he attributes to the shocks from earlier today.    Home Medications    Prior to Admission medications   Medication Sig Start Date End Date Taking? Authorizing Provider  ADVAIR DISKUS 100-50 MCG/DOSE AEPB Inhale 1 puff into the lungs Twice daily.  01/06/12   [provider]  atorvastatin (LIPITOR) 20 MG tablet Take 1 tablet by mouth daily. 02/26/18   [provider]  flecainide (TAMBOCOR) 100 MG tablet TAKE 1 TABLET BY MOUTH TWICE A DAY 03/05/18   Evans Lance, MD  FLUoxetine (PROZAC) 20 MG capsule Take 1 capsule by mouth daily. 02/25/18   [provider]  HYDROcodone-ibuprofen (VICOPROFEN) 7.5-200 MG tablet Take 1 tablet by mouth every 8 (eight) hours. 01/11/18   [provider]  PROAIR HFA 108 (90 BASE) MCG/ACT inhaler Inhale into the lungs as needed for wheezing or shortness of breath (Use as directed).  01/06/12   [provider]    Family History    Family History  Problem Relation Age of Onset  . Heart disease Maternal Grandmother    He indicated that his mother is alive. He indicated that his father is alive. He indicated that his maternal grandmother is alive. He indicated that his maternal grandfather is alive. He  indicated that his paternal grandmother is alive. He indicated that his paternal grandfather is alive.   Social History    Social History   Socioeconomic History  . Marital status: Married    Spouse name: Not on file  . Number of children: 3  . Years of education: Not on file  . Highest education level: Not on file  Occupational History  . 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 Financial resource strain: Not on file  . Food insecurity    Worry: Not on file    Inability: Not on file  . Transportation needs    Medical: Not on file     Non-medical: Not on file  Tobacco Use  . Smoking status: Never Smoker  . Smokeless tobacco: Never Used  Substance and Sexual Activity  . Alcohol use: No  . Drug use: No  . Sexual activity: Not on file  Lifestyle  . Physical activity    Days per week: Not on file    Minutes per session: Not on file  . Stress: Not on file  Relationships  . Social Musician on phone: Not on file    Gets together: Not on file    Attends religious service: Not on file    Active member of club or organization: Not on file    Attends meetings of clubs or organizations: Not on file    Relationship status: Not on file  . Intimate partner violence    Fear of current or ex partner: Not on file    Emotionally abused: Not on file    Physically abused: Not on file    Forced sexual activity: Not on file  Other Topics Concern  . Not on file  Social History Narrative  . Not on file     Review of Systems    General:  No chills, fever, night sweats or weight changes.  Cardiovascular:  As per HPI Dermatological: No rash, lesions/masses Respiratory: No cough, dyspnea Urologic: No hematuria, dysuria Abdominal:   No nausea, vomiting, diarrhea, bright red blood per rectum, melena, or hematemesis Neurologic:  No visual changes, otherwise as per HPI All other systems reviewed and are otherwise negative except as noted above.  Physical Exam    Blood pressure 121/75, pulse 64, temperature 98.2 F (36.8 C), resp. rate (!) 22, SpO2 100 %.  General: Pleasant, NAD Psych: Normal affect. Neuro: Alert and oriented X 3. Moves all extremities spontaneously. HEENT: Normal  Neck: Supple without bruits or JVD. Lungs:  Resp regular and unlabored, CTA. Heart: RRR with occasional premature beat; no s3, s4, or murmurs. Abdomen: Soft, non-tender, non-distended, BS + x 4.  Extremities: No clubbing, cyanosis or edema. DP/PT/Radials 2+ and equal bilaterally.  Labs    High Sensitivity Troponin No results for  input(s): TROPONINIHS in the last 720 hours.   Cardiac Enzymes No results for input(s): TROPONINI in the last 168 hours. No results for input(s): TROPIPOC in the last 168 hours.   Lab Results  Component Value Date   WBC 6.3 12/03/2018   HGB 14.0 12/03/2018   HCT 43.1 12/03/2018   MCV 92.1 12/03/2018   PLT 159 12/03/2018   No results for input(s): NA, K, CL, CO2, BUN, CREATININE, CALCIUM, PROT, BILITOT, ALKPHOS, ALT, AST, GLUCOSE in the last 168 hours.  Invalid input(s): LABALBU No results found for: CHOL, HDL, LDLCALC, TRIG No results found for: [facility]   Radiology Studies    Portable Chest 1 View  Result Date: 12/03/2018 CLINICAL DATA:  Supraventricular tachycardia. EXAM: PORTABLE CHEST 1 VIEW COMPARISON:  January 17, 2007 FINDINGS: The heart size and mediastinal contours are within normal limits. Both lungs are clear. The visualized skeletal structures are unremarkable. IMPRESSION: No active cardiopulmonary disease. Electronically Signed   By: Sherian ReinWei-Chen  Lin M.D.   On: 12/03/2018 22:00    ECG & Cardiac Imaging    12/03/18- personally reviewed. Sinus rhythm. Two abnormally conducted beats. Incomplete RBBB.   Assessment & Plan    Xavier [name] is a [age] yom with known accessory pathway s/p ablation years ago, asthma who presents due to syncope 2/2 known parahisian AP.   # SVT Increasing symptoms leading up to presentation with multiple episodes of syncope today. - Continue home flecainide - Consider additional anti-arrhythmic if any further sustained episodes - Telemetry - Will trend troponin, although unlikely to be ischemic etiology of arrhythmia - ECG now - CXR - TSH - EP consult in AM - will likely need ablation and potential for pacemaker placement given location of AP. - TTE in AM  # AKI Cr elevated at 1.7 on presentation. May be component of pre-renal, previously Cr baseline approximately 1.3.  - Will give gentle hydration overnight, follow-up creatinine in AM.  - Avoid  nephrotoxic meds   # Asthma No wheezing or SOB on admission - Cont home inhalers  # Normal diet  # FULL CODE  Signed, Starlyn SkeansAngela Ellaree Gear, MD 12/03/2018, 10:13 PM

## 2018-12-04 ENCOUNTER — Encounter (HOSPITAL_COMMUNITY): Payer: Self-pay | Admitting: *Deleted

## 2018-12-04 ENCOUNTER — Inpatient Hospital Stay (HOSPITAL_COMMUNITY): Payer: BC Managed Care – PPO

## 2018-12-04 DIAGNOSIS — I340 Nonrheumatic mitral (valve) insufficiency: Secondary | ICD-10-CM

## 2018-12-04 DIAGNOSIS — I371 Nonrheumatic pulmonary valve insufficiency: Secondary | ICD-10-CM

## 2018-12-04 DIAGNOSIS — I471 Supraventricular tachycardia: Secondary | ICD-10-CM

## 2018-12-04 LAB — HIV ANTIBODY (ROUTINE TESTING W REFLEX): HIV Screen 4th Generation wRfx: NONREACTIVE

## 2018-12-04 LAB — CBC
HCT: 42.3 % (ref 39.0–52.0)
Hemoglobin: 13.6 g/dL (ref 13.0–17.0)
MCH: 29.5 pg (ref 26.0–34.0)
MCHC: 32.2 g/dL (ref 30.0–36.0)
MCV: 91.8 fL (ref 80.0–100.0)
Platelets: 169 10*3/uL (ref 150–400)
RBC: 4.61 MIL/uL (ref 4.22–5.81)
RDW: 13.2 % (ref 11.5–15.5)
WBC: 6.3 10*3/uL (ref 4.0–10.5)
nRBC: 0 % (ref 0.0–0.2)

## 2018-12-04 LAB — BASIC METABOLIC PANEL
Anion gap: 8 (ref 5–15)
BUN: 20 mg/dL (ref 6–20)
CO2: 18 mmol/L — ABNORMAL LOW (ref 22–32)
Calcium: 8.4 mg/dL — ABNORMAL LOW (ref 8.9–10.3)
Chloride: 111 mmol/L (ref 98–111)
Creatinine, Ser: 1.31 mg/dL — ABNORMAL HIGH (ref 0.61–1.24)
GFR calc Af Amer: >60 mL/min (ref >60)
GFR calc non Af Amer: >60 mL/min (ref >60)
Glucose, Bld: 140 mg/dL — ABNORMAL HIGH (ref 70–99)
Potassium: 4 mmol/L (ref 3.5–5.1)
Sodium: 137 mmol/L (ref 135–145)

## 2018-12-04 LAB — ECHOCARDIOGRAM COMPLETE
Height: 68 in
Weight: 2712.54 oz

## 2018-12-04 LAB — MRSA PCR SCREENING: MRSA by PCR: NEGATIVE

## 2018-12-04 LAB — TROPONIN I (HIGH SENSITIVITY): Troponin I (High Sensitivity): 13 ng/L (ref <18)

## 2018-12-04 NOTE — Progress Notes (Signed)
Pt arrived to unit from 2H 

## 2018-12-04 NOTE — Plan of Care (Signed)

## 2018-12-04 NOTE — Progress Notes (Signed)
  Echocardiogram 2D Echocardiogram has been performed.  Xavier [name] 12/04/2018, 2:06 PM

## 2018-12-04 NOTE — Consult Note (Signed)
ELECTROPHYSIOLOGY CONSULT NOTE    Primary Care Physician: Leilani Ableeese, Betti, MD Referring Physician:  Admit Date: 12/03/2018  Reason for consultation: SVT  Xavier [name] is a 38 y.o. male with a h/o accessory pathway and SVT who is admitted with wide complex tachycardia and syncope.  The patient has had symptomatic arrhythmias for years.  Review of his chart reveals that he had EPS by Dr Ladona Ridgelaylor in 2007.  This demonstrated "an apparent unusual accessory pathway  (nodal fascicular which demonstrated evidence of both AV as well as VA dissociation during tachycardia. Because of the tachycardia's proximity to the AV node, no ablation therapy was delivered."  He has been treated with Ic AADs since that time.  Most recently, he was on flecainide 100mg  BID.  He reports compliance with this medicine.  Despite medical therapy, he has had abrupt onset/ offset of tachypalpitations monthly.  He was recently evaluated by Dr Ladona Ridgelaylor and discussions for repeat EPS and RFA were made.  Yesterday, he reports feeling abruptly ill and presyncopal while at work.  He did have syncope and EMS was called.  He was found to have WCT not presently available for review (likely SVT) for which he required cardioversion. He is admitted for further management.  No arrhythmias since admission.   Today, he denies symptoms of chest pain, shortness of breath, orthopnea, PND, lower extremity edema,  or neurologic sequela. The patient is tolerating medications without difficulties and is otherwise without complaint today.   Past Medical History:  Diagnosis Date  . SVT (supraventricular tachycardia) (HCC)    EPS by Dr Ladona Ridgelaylor 2007 demonstrated AP near the AV node for which ablation was deferred   Past Surgical History:  Procedure Laterality Date  . ELECTROPHYSIOLOGIC STUDY  2007   EP study by Dr Ladona Ridgelaylor revealed an apparent unusual accessory pathway for which ablation was deferred due to close proximity to the AV node  . LEG SURGERY      broken leg  . STOMACH SURGERY      . atorvastatin  20 mg Oral Daily  . Chlorhexidine Gluconate Cloth  6 each Topical Daily  . FLUoxetine  20 mg Oral Daily  . heparin  5,000 Units Subcutaneous Q8H  . mometasone-formoterol  2 puff Inhalation BID     No Known Allergies  Social History   Socioeconomic History  . Marital status: Married    Spouse name: Not on file  . Number of children: 3  . Years of education: Not on file  . Highest education level: Not on file  Occupational History  . 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  Social Needs  . Financial resource strain: Not on file  . Food insecurity    Worry: Not on file    Inability: Not on file  . Transportation needs    Medical: Not on file    Non-medical: Not on file  Tobacco Use  . Smoking status: Never Smoker  . Smokeless tobacco: Never Used  Substance and Sexual Activity  . Alcohol use: No  . Drug use: No  . Sexual activity: Not on file  Lifestyle  . Physical activity    Days per week: Not on file    Minutes per session: Not on file  . Stress: Not on file  Relationships  . Social Musicianconnections    Talks on phone: Not on file    Gets together: Not on file    Attends religious service: Not on file    Active member of club or organization: Not  on file    Attends meetings of clubs or organizations: Not on file    Relationship status: Not on file  . Intimate partner violence    Fear of current or ex partner: Not on file    Emotionally abused: Not on file    Physically abused: Not on file    Forced sexual activity: Not on file  Other Topics Concern  . Not on file  Social History Narrative  . Not on file    Family History  Problem Relation Age of Onset  . Heart disease Maternal Grandmother     ROS- All systems are reviewed and negative except as per the HPI above  Physical Exam: Telemetry:  Sinus with rare PVCs Vitals:   12/04/18 0631 12/04/18 0700 12/04/18 0733 12/04/18 0800  BP:  101/64  107/80  Pulse:  64 (!) 55  71  Resp: (!) 24 10  14   Temp:   97.9 F (36.6 C)   TempSrc:   Oral   SpO2: 100% 100%  100%  Weight:      Height:        GEN- The patient is well appearing, alert and oriented x 3 today.   Head- normocephalic, atraumatic Eyes-  Sclera clear, conjunctiva pink Ears- hearing intact Oropharynx- clear Neck- supple, no JVP Lymph- no cervical lymphadenopathy Lungs- Clear to ausculation bilaterally, normal work of breathing Heart- Regular rate and rhythm, no murmurs, rubs or gallops, PMI not laterally displaced GI- soft, NT, ND, + BS Extremities- no clubbing, cyanosis, or edema MS- no significant deformity or atrophy Skin- no rash or lesion Psych- euthymic mood, full affect Neuro- strength and sensation are intact  EKG-  Sinus rhythm with first degree AV block and incomplete RBBB  Echo pending  COVID 19 test- present [facility] is negative.  Labs:   Lab Results  Component Value Date   WBC 6.3 12/03/2018   HGB 13.6 12/03/2018   HCT 42.3 12/03/2018   MCV 91.8 12/03/2018   PLT 169 12/03/2018    Recent Labs  Lab 12/03/18 2134 12/03/18 2351  NA 137 137  K 4.1 4.0  CL 110 111  CO2 20* 18*  BUN 22* 20  CREATININE 1.62* 1.31*  CALCIUM 8.4* 8.4*  PROT 6.6  --   BILITOT 1.1  --   ALKPHOS 41  --   ALT 27  --   AST 29  --   GLUCOSE 94 140*   Lab Results  Component Value Date   CKTOTAL 144 01/18/2007   CKMB 0.9 01/18/2007   TROPONINI 0.01        NO INDICATION OF MYOCARDIAL INJURY. 01/18/2007   No results found for: CHOL No results found for: HDL No results found for: LDLCALC No results found for: TRIG No results found for: CHOLHDL No results found for: LDLDIRECT     Echo:  pending  ASSESSMENT AND PLAN:   1. SVT The patient has known SVT and an accessory pathway.  EP study by Dr Ladona Ridgel in 2007 revealed "an apparent unusual accessory pathway (nodal fascicular which demonstrated evidence of both AV as well as VA   dissociation during tachycardia. Because of the tachycardia's proximity to  the AV node, no ablation therapy was delivered." He has now failed medical therapy with flecainide.  AV slowly demonstrated on ekg (first degree AV block with iRBBB) suggests that we cannot increase flecainide further.  Therapeutic strategies for supraventricular tachycardia including medicine and ablation were discussed in  detail with the patient today. Risk, benefits, and alternatives to EP study and radiofrequency ablation were also discussed in detail today. He is aware of risks of AV block. He wishes to proceed.  We will therefore hold flecainide and plan EPS with Dr Lovena Le on Monday if schedule allows.   OK to transfer to telemetry   Thompson Grayer, MD 12/04/2018  9:48 AM

## 2018-12-05 MED ORDER — PNEUMOCOCCAL VAC POLYVALENT 25 MCG/0.5ML IJ INJ: 0.5000 mL | INJECTION | INTRAMUSCULAR | Status: DC

## 2018-12-05 MED ORDER — IBUPROFEN 600 MG PO TABS
600.0000 mg | ORAL_TABLET | Freq: Once | ORAL | Status: AC
  Administered 2018-12-05: 19:00:00 600 mg via ORAL
  Filled 2018-12-05: qty 1

## 2018-12-05 MED ORDER — SODIUM CHLORIDE 0.9 % IV SOLN
INTRAVENOUS | Status: DC
  Administered 2018-12-06: 11:00:00 via INTRAVENOUS

## 2018-12-05 NOTE — Significant Event (Signed)
Rapid Response Event Note  Overview:Called d/t SVT-190s. Pt's HR had been 60s (SR c 1 degree HB) prior to event. Pt here for SVT and scheduled for ablation in AM. Time Called: 2255 Arrival Time: 2258 Event Type: Cardiac  Initial Focused Assessment: RN had placed zoll pads on pt PTA RRT and pt's HR decreased to 90s, SBP-130s. On my arrival, pt sitting up in bed, HR-90s(SR).Pt alert and oriented, in no distress, denying CP or SOB.    Interventions: FiO2 placed on pt during event, may wean off as tolerated.  Plan of Care (if not transferred): Continue to monitor. Call RRT if further assistance needed. Event Summary:   at      at          [facility], Carren Rang

## 2018-12-05 NOTE — Progress Notes (Signed)
Progress Note   Subjective   Doing well today, the patient denies CP or SOB.  No new concerns  Inpatient Medications    Scheduled Meds: . atorvastatin  20 mg Oral Daily  . Chlorhexidine Gluconate Cloth  6 each Topical Daily  . FLUoxetine  20 mg Oral Daily  . heparin  5,000 Units Subcutaneous Q8H  . mometasone-formoterol  2 puff Inhalation BID   Continuous Infusions:  PRN Meds: acetaminophen, albuterol, ondansetron (ZOFRAN) IV   Vital Signs    Vitals:   12/04/18 1949 12/04/18 2033 12/05/18 0521 12/05/18 0815  BP:  129/75 110/80   Pulse:  (!) 56 68   Resp:  16 13   Temp:  98.1 F (36.7 C) 97.6 F (36.4 C)   TempSrc:  Oral Oral   SpO2: 99% 100% 100% 98%  Weight:   76.6 kg   Height:        Intake/Output Summary (Last 24 hours) at 12/05/2018 4270 Last data filed at 12/04/2018 1900 Gross per 24 hour  Intake 480 ml  Output 100 ml  Net 380 ml   Filed Weights   12/04/18 0600 12/05/18 0521  Weight: 76.9 kg 76.6 kg    Telemetry    Sinus with rare PVCs - Personally Reviewed  Physical Exam   GEN- The patient is well appearing, sleeping but rouses Head- normocephalic, atraumatic Eyes-  Sclera clear, conjunctiva pink Ears- hearing intact Oropharynx- clear Neck- supple, Lungs-   normal work of breathing Heart- Regular rate and rhythm  GI- soft, NT, ND, + BS Extremities- no clubbing, cyanosis, or edema  MS- no significant deformity or atrophy Skin- no rash or lesion Psych- euthymic mood, full affect Neuro- strength and sensation are intact   Labs    Chemistry Recent Labs  Lab 12/03/18 2134 12/03/18 2351  NA 137 137  K 4.1 4.0  CL 110 111  CO2 20* 18*  GLUCOSE 94 140*  BUN 22* 20  CREATININE 1.62* 1.31*  CALCIUM 8.4* 8.4*  PROT 6.6  --   ALBUMIN 3.7  --   AST 29  --   ALT 27  --   ALKPHOS 41  --   BILITOT 1.1  --   GFRNONAA 53* >60  GFRAA >60 >60  ANIONGAP 7 8     Hematology Recent Labs  Lab 12/03/18 2134 12/03/18 2351  WBC 6.3 6.3   RBC 4.68 4.61  HGB 14.0 13.6  HCT 43.1 42.3  MCV 92.1 91.8  MCH 29.9 29.5  MCHC 32.5 32.2  RDW 13.2 13.2  PLT 159 169       Assessment & Plan    Xavier [name] is a 38 y.o. male with a h/o accessory pathway and SVT who is admitted with wide complex tachycardia and syncope.    1.  SVT The patient has known SVT and an accessory pathway.  EP study by Dr Lovena Le in 2007 revealed "an apparent unusual accessory pathway (nodal fascicular which demonstrated evidence of both AV as well as VA dissociation during tachycardia. Because of the tachycardia's proximity to the AV node, no ablation therapy was delivered." He has now failed medical therapy with flecainide.  AV slowly demonstrated on ekg (first degree AV block with iRBBB) suggests that we cannot increase flecainide further.   Risk, benefits, and alternatives to EP study and radiofrequency ablation were also discussed in detail today.  The patient understands these risk and wishes to proceed.  We will therefore plan ablation with Dr Lovena Le tomorrow.  Orders placed by me today   Hillis Range MD, [facility] 12/05/2018 9:43 AM

## 2018-12-06 ENCOUNTER — Encounter (HOSPITAL_COMMUNITY)
Admission: AD | Disposition: A | Discharge status: Home / Self Care | Payer: Self-pay | Source: Other Acute Inpatient Hospital | Attending: Cardiology

## 2018-12-06 DIAGNOSIS — I471 Supraventricular tachycardia: Secondary | ICD-10-CM

## 2018-12-06 DIAGNOSIS — I4892 Unspecified atrial flutter: Secondary | ICD-10-CM

## 2018-12-06 HISTORY — PX: SVT ABLATION: EP1225

## 2018-12-06 SURGERY — SVT ABLATION
Anesthesia: LOCAL

## 2018-12-06 MED ORDER — MIDAZOLAM HCL 5 MG/5ML IJ SOLN
INTRAMUSCULAR | Status: AC
  Filled 2018-12-06: qty 5

## 2018-12-06 MED ORDER — FENTANYL CITRATE (PF) 100 MCG/2ML IJ SOLN
INTRAMUSCULAR | Status: DC | PRN
  Administered 2018-12-06: 25 ug via INTRAVENOUS
  Administered 2018-12-06 (×5): 12.5 ug via INTRAVENOUS
  Administered 2018-12-06: 25 ug via INTRAVENOUS

## 2018-12-06 MED ORDER — SODIUM CHLORIDE 0.9% FLUSH: 3.0000 mL | INTRAVENOUS | Status: DC | PRN

## 2018-12-06 MED ORDER — HEPARIN (PORCINE) IN NACL 1000-0.9 UT/500ML-% IV SOLN
INTRAVENOUS | Status: AC
  Filled 2018-12-06: qty 500

## 2018-12-06 MED ORDER — ACETAMINOPHEN 325 MG PO TABS: 650.0000 mg | ORAL_TABLET | ORAL | Status: DC | PRN

## 2018-12-06 MED ORDER — FENTANYL CITRATE (PF) 100 MCG/2ML IJ SOLN
INTRAMUSCULAR | Status: AC
  Filled 2018-12-06: qty 2

## 2018-12-06 MED ORDER — ISOPROTERENOL HCL 0.2 MG/ML IJ SOLN
INTRAMUSCULAR | Status: AC
  Filled 2018-12-06: qty 5

## 2018-12-06 MED ORDER — BUPIVACAINE HCL (PF) 0.25 % IJ SOLN
INTRAMUSCULAR | Status: DC | PRN
  Administered 2018-12-06: 45 mL

## 2018-12-06 MED ORDER — ONDANSETRON HCL 4 MG/2ML IJ SOLN: 4.0000 mg | Freq: Four times a day (QID) | INTRAMUSCULAR | Status: DC | PRN

## 2018-12-06 MED ORDER — BUPIVACAINE HCL (PF) 0.25 % IJ SOLN
INTRAMUSCULAR | Status: AC
  Filled 2018-12-06: qty 60

## 2018-12-06 MED ORDER — HEPARIN (PORCINE) IN NACL 1000-0.9 UT/500ML-% IV SOLN
INTRAVENOUS | Status: DC | PRN
  Administered 2018-12-06: 500 mL

## 2018-12-06 MED ORDER — SODIUM CHLORIDE 0.9% FLUSH
3.0000 mL | Freq: Two times a day (BID) | INTRAVENOUS | Status: DC
  Administered 2018-12-07 – 2018-12-08 (×3): 3 mL via INTRAVENOUS

## 2018-12-06 MED ORDER — SODIUM CHLORIDE 0.9 % IV SOLN: 250.0000 mL | INTRAVENOUS | Status: DC | PRN

## 2018-12-06 MED ORDER — MIDAZOLAM HCL 5 MG/5ML IJ SOLN
INTRAMUSCULAR | Status: DC | PRN
  Administered 2018-12-06 (×3): 1 mg via INTRAVENOUS
  Administered 2018-12-06: 2 mg via INTRAVENOUS
  Administered 2018-12-06: 1 mg via INTRAVENOUS
  Administered 2018-12-06 (×5): 2 mg via INTRAVENOUS

## 2018-12-06 MED ORDER — SODIUM CHLORIDE 0.9 % IV SOLN
INTRAVENOUS | Status: DC | PRN
  Administered 2018-12-06: 4 ug/min via INTRAVENOUS

## 2018-12-06 SURGICAL SUPPLY — 10 items
CATH HEX JOSEPH 2-5-2 65CM 6F (CATHETERS) ×1 IMPLANT
CATH JOSEPH QUAD ALLRED 6F REP (CATHETERS) ×2 IMPLANT
COVER DOME SNAP 22 D (MISCELLANEOUS) ×1 IMPLANT
PACK EP LATEX FREE (CUSTOM PROCEDURE TRAY) ×2
PACK EP LF (CUSTOM PROCEDURE TRAY) ×1 IMPLANT
PAD PRO RADIOLUCENT 2001M-C (PAD) ×2 IMPLANT
PATCH CARTO3 (PAD) ×1 IMPLANT
SHEATH PINNACLE 6F 10CM (SHEATH) ×2 IMPLANT
SHEATH PINNACLE 7F 10CM (SHEATH) ×1 IMPLANT
SHEATH PINNACLE 8F 10CM (SHEATH) ×1 IMPLANT

## 2018-12-06 NOTE — Progress Notes (Addendum)
Electrophysiology Rounding Note  Patient Name: Xavier [name] Date of Encounter: 12/06/2018   Subjective   Doing OK currently. No questions at this time. Denies CP or SOB. Had recurrent SVT overnight.   Inpatient Medications    Scheduled Meds: . atorvastatin  20 mg Oral Daily  . Chlorhexidine Gluconate Cloth  6 each Topical Daily  . FLUoxetine  20 mg Oral Daily  . heparin  5,000 Units Subcutaneous Q8H  . mometasone-formoterol  2 puff Inhalation BID  . pneumococcal 23 valent vaccine  0.5 mL Intramuscular Tomorrow-1000   Continuous Infusions: . sodium chloride     PRN Meds: acetaminophen, albuterol, ondansetron (ZOFRAN) IV   Vital Signs    Vitals:   12/05/18 1617 12/05/18 2041 12/05/18 2111 12/06/18 0506  BP: 122/75  (!) 143/82 126/84  Pulse: (!) 50  (!) 55 63  Resp:   17 18  Temp: 97.7 F (36.5 C)  98.2 F (36.8 C) 97.7 F (36.5 C)  TempSrc: Oral  Oral Oral  SpO2: 100% 100% 100% 100%  Weight:    76.5 kg  Height:        Intake/Output Summary (Last 24 hours) at 12/06/2018 0734 Last data filed at 12/06/2018 0600 Gross per 24 hour  Intake 720 ml  Output 435 ml  Net 285 ml   Filed Weights   12/04/18 0600 12/05/18 0521 12/06/18 0506  Weight: 76.9 kg 76.6 kg 76.5 kg    Physical Exam    GEN- The patient is well appearing, alert and oriented x 3 today.   Head- normocephalic, atraumatic Eyes-  Sclera clear, conjunctiva pink Ears- hearing intact Oropharynx- clear Neck- supple Lungs- Clear to ausculation bilaterally, normal work of breathing Heart- Regular rate and rhythm, no murmurs, rubs or gallops GI- soft, NT, ND, + BS Extremities- no clubbing, cyanosis, or edema Skin- no rash or lesion Psych- euthymic mood, full affect Neuro- strength and sensation are intact  Labs    CBC Recent Labs    12/03/18 2134 12/03/18 2351  WBC 6.3 6.3  NEUTROABS 4.5  --   HGB 14.0 13.6  HCT 43.1 42.3  MCV 92.1 91.8  PLT 159 169   Basic Metabolic Panel Recent Labs    22/97/98 2134 12/03/18 2351  NA 137 137  K 4.1 4.0  CL 110 111  CO2 20* 18*  GLUCOSE 94 140*  BUN 22* 20  CREATININE 1.62* 1.31*  CALCIUM 8.4* 8.4*  MG 2.0  --    Liver Function Tests Recent Labs    12/03/18 2134  AST 29  ALT 27  ALKPHOS 41  BILITOT 1.1  PROT 6.6  ALBUMIN 3.7   No results for input(s): LIPASE, AMYLASE in the last 72 hours. Cardiac Enzymes No results for input(s): CKTOTAL, CKMB, CKMBINDEX, TROPONINI in the last 72 hours. BNP Invalid input(s): POCBNP D-Dimer No results for input(s): DDIMER in the last 72 hours. Hemoglobin A1C No results for input(s): HGBA1C in the last 72 hours. Fasting Lipid Panel No results for input(s): CHOL, HDL, LDLCALC, TRIG, CHOLHDL, LDLDIRECT in the last 72 hours. Thyroid Function Tests Recent Labs    12/03/18 2134  TSH 0.604    Telemetry    NSR with occasional PVCS in 50-60s, Episode of SVT ~ 0030 with rates 150-200. SVT 8/2 ~ 1338 with rates in 160s  Radiology    No results found.   Patient Profile     Xavier [name] a 38 y.o.malewith a h/o accessory pathway and SVT who is admitted with wide  complex tachycardia and syncope.   Assessment & Plan    1. SVT - Pt with known SVT and an accessory pathway. Plan for SVT ablation this afternoon. He has failed medical therapy with flecainide (AV slowly with 1st degree block and iRBBB prohibits increasing dose).  - Of note EPS by Dr. Lovena Le in 2007 showed "an apparent unusual accessory pathway (nodal fascicular which demonstrated evidence of both AV as well as VA dissociation during tachycardia. Because of the tachycardia's proximity tothe AV node, no ablation therapy was delivered."  For questions or updates, please contact [facility] Please consult [email or web address] for contact info under Cardiology/STEMI.  Signed, Shirley Friar, PA-C  12/06/2018, 7:34 AM    EP Attending  Patient seen and examined. Agree with above. The patient has had a recurrent  wide QRS tachycardia. I have discussed the indications/risks/benefits/goals/expectations of Ep study and catheter ablation  And he wishes to proceed.  Mikle Bosworth.D.

## 2018-12-06 NOTE — Progress Notes (Signed)
Site area: rt groin fv sheaths x3 pulled and pressure held by [employer] Prior to Removal:  Level 0 Pressure Applied For: 15 minutes Manual:   yes Patient Status During Pull:  stable Post Pull Site:  Level 0 Post Pull Instructions Given:  yes Post Pull Pulses Present: rt dp palpable Dressing Applied:  Gauze and tegaderm Bedrest begins @ 1840 Comments:  IV saline locked

## 2018-12-06 NOTE — Progress Notes (Signed)
Dressing on right groin showed new drainage. Removed dressing to assess site. No active bleeding and site is a level 0. Reapplied dressing and reeducated patient regarding bedrest. Will continue to monitor.

## 2018-12-06 NOTE — Progress Notes (Signed)
Pt's HR 150'-200's SVT. Pt asymptomatic. RRT called and Cardiology notified. Pt returned to [place] in the 90's.

## 2018-12-06 NOTE — H&P (View-Only) (Signed)
 Electrophysiology Rounding Note  Patient Name: Xavier [name] Date of Encounter: 12/06/2018   Subjective   Doing OK currently. No questions at this time. Denies CP or SOB. Had recurrent SVT overnight.   Inpatient Medications    Scheduled Meds: . atorvastatin  20 mg Oral Daily  . Chlorhexidine Gluconate Cloth  6 each Topical Daily  . FLUoxetine  20 mg Oral Daily  . heparin  5,000 Units Subcutaneous Q8H  . mometasone-formoterol  2 puff Inhalation BID  . pneumococcal 23 valent vaccine  0.5 mL Intramuscular Tomorrow-1000   Continuous Infusions: . sodium chloride     PRN Meds: acetaminophen, albuterol, ondansetron (ZOFRAN) IV   Vital Signs    Vitals:   12/05/18 1617 12/05/18 2041 12/05/18 2111 12/06/18 0506  BP: 122/75  (!) 143/82 126/84  Pulse: (!) 50  (!) 55 63  Resp:   17 18  Temp: 97.7 F (36.5 C)  98.2 F (36.8 C) 97.7 F (36.5 C)  TempSrc: Oral  Oral Oral  SpO2: 100% 100% 100% 100%  Weight:    76.5 kg  Height:        Intake/Output Summary (Last 24 hours) at 12/06/2018 0734 Last data filed at 12/06/2018 0600 Gross per 24 hour  Intake 720 ml  Output 435 ml  Net 285 ml   Filed Weights   12/04/18 0600 12/05/18 0521 12/06/18 0506  Weight: 76.9 kg 76.6 kg 76.5 kg    Physical Exam    GEN- The patient is well appearing, alert and oriented x 3 today.   Head- normocephalic, atraumatic Eyes-  Sclera clear, conjunctiva pink Ears- hearing intact Oropharynx- clear Neck- supple Lungs- Clear to ausculation bilaterally, normal work of breathing Heart- Regular rate and rhythm, no murmurs, rubs or gallops GI- soft, NT, ND, + BS Extremities- no clubbing, cyanosis, or edema Skin- no rash or lesion Psych- euthymic mood, full affect Neuro- strength and sensation are intact  Labs    CBC Recent Labs    12/03/18 2134 12/03/18 2351  WBC 6.3 6.3  NEUTROABS 4.5  --   HGB 14.0 13.6  HCT 43.1 42.3  MCV 92.1 91.8  PLT 159 169   Basic Metabolic Panel Recent Labs    12/03/18 2134 12/03/18 2351  NA 137 137  K 4.1 4.0  CL 110 111  CO2 20* 18*  GLUCOSE 94 140*  BUN 22* 20  CREATININE 1.62* 1.31*  CALCIUM 8.4* 8.4*  MG 2.0  --    Liver Function Tests Recent Labs    12/03/18 2134  AST 29  ALT 27  ALKPHOS 41  BILITOT 1.1  PROT 6.6  ALBUMIN 3.7   No results for input(s): LIPASE, AMYLASE in the last 72 hours. Cardiac Enzymes No results for input(s): CKTOTAL, CKMB, CKMBINDEX, TROPONINI in the last 72 hours. BNP Invalid input(s): POCBNP D-Dimer No results for input(s): DDIMER in the last 72 hours. Hemoglobin A1C No results for input(s): HGBA1C in the last 72 hours. Fasting Lipid Panel No results for input(s): CHOL, HDL, LDLCALC, TRIG, CHOLHDL, LDLDIRECT in the last 72 hours. Thyroid Function Tests Recent Labs    12/03/18 2134  TSH 0.604    Telemetry    NSR with occasional PVCS in 50-60s, Episode of SVT ~ 0030 with rates 150-200. SVT 8/2 ~ 1338 with rates in 160s  Radiology    No results found.   Patient Profile     Xavier Wehrmanis a 38 y.o.malewith a h/o accessory pathway and SVT who is admitted with wide   complex tachycardia and syncope.   Assessment & Plan    1. SVT - Pt with known SVT and an accessory pathway. Plan for SVT ablation this afternoon. He has failed medical therapy with flecainide (AV slowly with 1st degree block and iRBBB prohibits increasing dose).  - Of note EPS by Dr. Lovena Le in 2007 showed "an apparent unusual accessory pathway (nodal fascicular which demonstrated evidence of both AV as well as VA dissociation during tachycardia. Because of the tachycardia's proximity tothe AV node, no ablation therapy was delivered."  For questions or updates, please contact [facility] Please consult [email or web address] for contact info under Cardiology/STEMI.  Signed, Shirley Friar, PA-C  12/06/2018, 7:34 AM    EP Attending  Patient seen and examined. Agree with above. The patient has had a recurrent  wide QRS tachycardia. I have discussed the indications/risks/benefits/goals/expectations of Ep study and catheter ablation  And he wishes to proceed.  Mikle Bosworth.D.

## 2018-12-06 NOTE — Progress Notes (Signed)
Site area: rt ij venous sheath Site Prior to Removal:  Level 0 Pressure Applied For: 10 minutes Manual:   yes Patient Status During Pull:  stable Post Pull Site:  Level 0 Post Pull Instructions Given:  yes Post Pull Pulses Present: na Dressing Applied:vasolene gauze, gauze and tegaderm Bedrest begins @  Comments:

## 2018-12-06 NOTE — Interval H&P Note (Signed)
History and Physical Interval Note:  12/06/2018 1:34 PM  Shaiden Aldous  has presented today for surgery, with the diagnosis of SVT.  The various methods of treatment have been discussed with the patient and family. After consideration of risks, benefits and other options for treatment, the patient has consented to  Procedure(s): SVT ABLATION (N/A) as a surgical intervention.  The patient's history has been reviewed, patient examined, no change in status, stable for surgery.  I have reviewed the patient's chart and labs.  Questions were answered to the patient's satisfaction.     [name]

## 2018-12-07 ENCOUNTER — Inpatient Hospital Stay (HOSPITAL_COMMUNITY): Payer: BC Managed Care – PPO

## 2018-12-07 ENCOUNTER — Encounter (HOSPITAL_COMMUNITY): Payer: Self-pay | Admitting: Internal Medicine

## 2018-12-07 ENCOUNTER — Encounter (HOSPITAL_COMMUNITY)
Admission: AD | Disposition: A | Discharge status: Home / Self Care | Payer: Self-pay | Source: Other Acute Inpatient Hospital | Attending: Cardiology

## 2018-12-07 DIAGNOSIS — I472 Ventricular tachycardia: Secondary | ICD-10-CM

## 2018-12-07 DIAGNOSIS — I471 Supraventricular tachycardia: Secondary | ICD-10-CM

## 2018-12-07 HISTORY — PX: ICD IMPLANT: EP1208

## 2018-12-07 LAB — SURGICAL PCR SCREEN
MRSA, PCR: NEGATIVE
Staphylococcus aureus: NEGATIVE

## 2018-12-07 SURGERY — ICD IMPLANT

## 2018-12-07 MED ORDER — LIDOCAINE HCL 1 % IJ SOLN
INTRAMUSCULAR | Status: AC
  Filled 2018-12-07: qty 60

## 2018-12-07 MED ORDER — CEFAZOLIN SODIUM-DEXTROSE 1-4 GM/50ML-% IV SOLN
1.0000 g | Freq: Four times a day (QID) | INTRAVENOUS | Status: AC
  Administered 2018-12-07 – 2018-12-08 (×3): 1 g via INTRAVENOUS
  Filled 2018-12-07 (×3): qty 50

## 2018-12-07 MED ORDER — LIDOCAINE HCL (PF) 1 % IJ SOLN
INTRAMUSCULAR | Status: DC | PRN
  Administered 2018-12-07: 70 mL

## 2018-12-07 MED ORDER — HEPARIN SODIUM (PORCINE) 5000 UNIT/ML IJ SOLN: 5000.0000 [IU] | Freq: Three times a day (TID) | INTRAMUSCULAR | Status: DC

## 2018-12-07 MED ORDER — MIDAZOLAM HCL 5 MG/5ML IJ SOLN
INTRAMUSCULAR | Status: DC | PRN
  Administered 2018-12-07: 1 mg via INTRAVENOUS
  Administered 2018-12-07: 2 mg via INTRAVENOUS
  Administered 2018-12-07 (×2): 1 mg via INTRAVENOUS

## 2018-12-07 MED ORDER — CEFAZOLIN SODIUM-DEXTROSE 2-4 GM/100ML-% IV SOLN
2.0000 g | INTRAVENOUS | Status: AC
  Administered 2018-12-07: 16:00:00 2 g via INTRAVENOUS

## 2018-12-07 MED ORDER — HEPARIN (PORCINE) IN NACL 1000-0.9 UT/500ML-% IV SOLN
INTRAVENOUS | Status: DC | PRN
  Administered 2018-12-07: 500 mL

## 2018-12-07 MED ORDER — SODIUM CHLORIDE 0.9 % IV SOLN
INTRAVENOUS | Status: DC
  Administered 2018-12-07: 13:00:00 via INTRAVENOUS

## 2018-12-07 MED ORDER — CEFAZOLIN SODIUM-DEXTROSE 2-4 GM/100ML-% IV SOLN
INTRAVENOUS | Status: AC
  Filled 2018-12-07: qty 100

## 2018-12-07 MED ORDER — ONDANSETRON HCL 4 MG/2ML IJ SOLN: 4.0000 mg | Freq: Four times a day (QID) | INTRAMUSCULAR | Status: DC | PRN

## 2018-12-07 MED ORDER — IOHEXOL 350 MG/ML SOLN
INTRAVENOUS | Status: DC | PRN
  Administered 2018-12-07: 20 mL via INTRAVENOUS

## 2018-12-07 MED ORDER — OXYCODONE HCL 5 MG PO TABS
5.0000 mg | ORAL_TABLET | ORAL | Status: AC | PRN
  Administered 2018-12-07 – 2018-12-08 (×2): 5 mg via ORAL
  Filled 2018-12-07 (×2): qty 1

## 2018-12-07 MED ORDER — ACETAMINOPHEN 325 MG PO TABS: 325.0000 mg | ORAL_TABLET | ORAL | Status: DC | PRN

## 2018-12-07 MED ORDER — MIDAZOLAM HCL 5 MG/5ML IJ SOLN
INTRAMUSCULAR | Status: AC
  Filled 2018-12-07: qty 5

## 2018-12-07 MED ORDER — FENTANYL CITRATE (PF) 100 MCG/2ML IJ SOLN
INTRAMUSCULAR | Status: DC | PRN
  Administered 2018-12-07 (×3): 12.5 ug via INTRAVENOUS
  Administered 2018-12-07: 25 ug via INTRAVENOUS
  Administered 2018-12-07: 12.5 ug via INTRAVENOUS

## 2018-12-07 MED ORDER — FENTANYL CITRATE (PF) 100 MCG/2ML IJ SOLN
INTRAMUSCULAR | Status: AC
  Filled 2018-12-07: qty 2

## 2018-12-07 MED ORDER — GADOBUTROL 1 MMOL/ML IV SOLN
10.0000 mL | Freq: Once | INTRAVENOUS | Status: AC | PRN
  Administered 2018-12-07: 09:00:00 10 mL via INTRAVENOUS

## 2018-12-07 MED ORDER — SODIUM CHLORIDE 0.9 % IV SOLN
80.0000 mg | INTRAVENOUS | Status: AC
  Administered 2018-12-07: 17:00:00 80 mg
  Filled 2018-12-07: qty 2

## 2018-12-07 SURGICAL SUPPLY — 9 items
CABLE SURGICAL S-101-97-12 (CABLE) ×3 IMPLANT
GUIDEWIRE ANGLED .035X150CM (WIRE) ×2 IMPLANT
ICD ELLIPSE DR CD2411-36C (ICD Generator) ×2 IMPLANT
LEAD DURATA 7122-65CM (Lead) ×2 IMPLANT
LEAD TENDRIL MRI 52CM LPA1200M (Lead) ×2 IMPLANT
PAD PRO RADIOLUCENT 2001M-C (PAD) ×3 IMPLANT
SHEATH 8FR PRELUDE SNAP 13 (SHEATH) ×2 IMPLANT
SHEATH CLASSIC 7F (SHEATH) ×2 IMPLANT
TRAY PACEMAKER INSERTION (PACKS) ×3 IMPLANT

## 2018-12-07 NOTE — Progress Notes (Addendum)
Electrophysiology Rounding Note  Patient Name: Xavier [name] Date of Encounter: 12/07/2018  Primary Cardiologist: No primary care provider on file.  Electrophysiologist: Dr. Lovena Le   Subjective   The patient is doing well at this time. He denies chest pain, shortness of breath, or any new concerns.  EPS 12/06/2018 showed "a wide as well as narrow QRS tachycardia with VA dissociation making a presumptive diagnosis of ventricular tachycardia."  Inpatient Medications    Scheduled Meds: . atorvastatin  20 mg Oral Daily  . Chlorhexidine Gluconate Cloth  6 each Topical Daily  . FLUoxetine  20 mg Oral Daily  . heparin  5,000 Units Subcutaneous Q8H  . mometasone-formoterol  2 puff Inhalation BID  . pneumococcal 23 valent vaccine  0.5 mL Intramuscular Tomorrow-1000  . sodium chloride flush  3 mL Intravenous Q12H   Continuous Infusions: . sodium chloride     PRN Meds: sodium chloride, acetaminophen, albuterol, ondansetron (ZOFRAN) IV, sodium chloride flush   Vital Signs    Vitals:   12/06/18 2030 12/07/18 0342 12/07/18 0545 12/07/18 0728  BP:  118/65    Pulse:  (!) 54  73  Resp:    19  Temp: 98 F (36.7 C)  97.8 F (36.6 C)   TempSrc: Oral  Oral   SpO2:  99%  99%  Weight:   76.1 kg   Height:        Intake/Output Summary (Last 24 hours) at 12/07/2018 0845 Last data filed at 12/07/2018 0432 Gross per 24 hour  Intake 344.51 ml  Output 450 ml  Net -105.49 ml   Filed Weights   12/05/18 0521 12/06/18 0506 12/07/18 0545  Weight: 76.6 kg 76.5 kg 76.1 kg    Physical Exam    GEN- The patient is well appearing, alert and oriented x 3 today.   Head- normocephalic, atraumatic Eyes-  Sclera clear, conjunctiva pink Ears- hearing intact Oropharynx- clear Neck- supple Lungs- Clear to ausculation bilaterally, normal work of breathing Heart- Regular rate and rhythm, no murmurs, rubs or gallops GI- soft, NT, ND, + BS Extremities- no clubbing, cyanosis, or edema Skin- no rash or  lesion Psych- euthymic mood, full affect Neuro- strength and sensation are intact  Labs    CBC No results for input(s): WBC, NEUTROABS, HGB, HCT, MCV, PLT in the last 72 hours. Basic Metabolic Panel No results for input(s): NA, K, CL, CO2, GLUCOSE, BUN, CREATININE, CALCIUM, MG, PHOS in the last 72 hours. Liver Function Tests No results for input(s): AST, ALT, ALKPHOS, BILITOT, PROT, ALBUMIN in the last 72 hours. No results for input(s): LIPASE, AMYLASE in the last 72 hours. Cardiac Enzymes No results for input(s): CKTOTAL, CKMB, CKMBINDEX, TROPONINI in the last 72 hours. BNP Invalid input(s): POCBNP D-Dimer No results for input(s): DDIMER in the last 72 hours. Hemoglobin A1C No results for input(s): HGBA1C in the last 72 hours. Fasting Lipid Panel No results for input(s): CHOL, HDL, LDLCALC, TRIG, CHOLHDL, LDLDIRECT in the last 72 hours. Thyroid Function Tests No results for input(s): TSH, T4TOTAL, T3FREE, THYROIDAB in the last 72 hours.  Invalid input(s): FREET3  Telemetry    NSR 80s currently, 1 additional episode of SVT/narrow VT ~ 2330 for approx 1 minute (personally reviewed)  Radiology    No results found.  Patient Profile     Xavier [name] a 38 y.o.malewith a h/o accessory pathway and SVT who is admitted with wide complex tachycardia and syncope.  Assessment & Plan    1. SVT vs VT EPS 8/3  showed "a wide as well as narrow QRS tachycardia with VA dissociation making a presumptive diagnosis of ventricular tachycardia." Pt sent for cMRI this am.  Dr Ladona Ridgel to review and discuss with patient for plan/disposition. With VT and syncope, pt made aware of [employer] regulations stating no driving for 6 months.  For questions or updates, please contact [facility] Please consult [email or web address] for contact info under Cardiology/STEMI.  Signed, Graciella Freer, PA-C  12/07/2018, 8:45 AM   EP Attending  Patient seen and examined. I have reviewed the findings  of his EP study in detail as well as the findings of cardiac MRI. The patient has 2 potentially worrisome findings on MRI. 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 The abnormality has been associated with VT and sudden death. However the VT we induced in EP lab yesterday, did not appear to be originating or at least exiting from the LV based on LBBB QRS with delayed transition from negative to positive. In addition the narrow complex VT has to originate in the conduction system. The second finding of thinning in the base of the septum with evidence of gadolinium enhancement would be expected with the VT observed.   In light of the above, I have recommended proceeding with ICD insertion. I suspect he will eventually require AA drug therapy as well. Patients with sarcoid will often have periods where the VT is very active followed by periods, sometimes years of stability and no VT. PET scanning would certainly confirm diagnosis but in the short term would not change the treatment.   Xavier [name].D.

## 2018-12-07 NOTE — H&P (View-Only) (Signed)
Electrophysiology Rounding Note  Patient Name: Xavier [name] Date of Encounter: 12/07/2018  Primary Cardiologist: No primary care provider on file.  Electrophysiologist: Dr. Lovena Le   Subjective   The patient is doing well at this time. He denies chest pain, shortness of breath, or any new concerns.  EPS 12/06/2018 showed "a wide as well as narrow QRS tachycardia with VA dissociation making a presumptive diagnosis of ventricular tachycardia."  Inpatient Medications    Scheduled Meds: . atorvastatin  20 mg Oral Daily  . Chlorhexidine Gluconate Cloth  6 each Topical Daily  . FLUoxetine  20 mg Oral Daily  . heparin  5,000 Units Subcutaneous Q8H  . mometasone-formoterol  2 puff Inhalation BID  . pneumococcal 23 valent vaccine  0.5 mL Intramuscular Tomorrow-1000  . sodium chloride flush  3 mL Intravenous Q12H   Continuous Infusions: . sodium chloride     PRN Meds: sodium chloride, acetaminophen, albuterol, ondansetron (ZOFRAN) IV, sodium chloride flush   Vital Signs    Vitals:   12/06/18 2030 12/07/18 0342 12/07/18 0545 12/07/18 0728  BP:  118/65    Pulse:  (!) 54  73  Resp:    19  Temp: 98 F (36.7 C)  97.8 F (36.6 C)   TempSrc: Oral  Oral   SpO2:  99%  99%  Weight:   76.1 kg   Height:        Intake/Output Summary (Last 24 hours) at 12/07/2018 0845 Last data filed at 12/07/2018 0432 Gross per 24 hour  Intake 344.51 ml  Output 450 ml  Net -105.49 ml   Filed Weights   12/05/18 0521 12/06/18 0506 12/07/18 0545  Weight: 76.6 kg 76.5 kg 76.1 kg    Physical Exam    GEN- The patient is well appearing, alert and oriented x 3 today.   Head- normocephalic, atraumatic Eyes-  Sclera clear, conjunctiva pink Ears- hearing intact Oropharynx- clear Neck- supple Lungs- Clear to ausculation bilaterally, normal work of breathing Heart- Regular rate and rhythm, no murmurs, rubs or gallops GI- soft, NT, ND, + BS Extremities- no clubbing, cyanosis, or edema Skin- no rash or  lesion Psych- euthymic mood, full affect Neuro- strength and sensation are intact  Labs    CBC No results for input(s): WBC, NEUTROABS, HGB, HCT, MCV, PLT in the last 72 hours. Basic Metabolic Panel No results for input(s): NA, K, CL, CO2, GLUCOSE, BUN, CREATININE, CALCIUM, MG, PHOS in the last 72 hours. Liver Function Tests No results for input(s): AST, ALT, ALKPHOS, BILITOT, PROT, ALBUMIN in the last 72 hours. No results for input(s): LIPASE, AMYLASE in the last 72 hours. Cardiac Enzymes No results for input(s): CKTOTAL, CKMB, CKMBINDEX, TROPONINI in the last 72 hours. BNP Invalid input(s): POCBNP D-Dimer No results for input(s): DDIMER in the last 72 hours. Hemoglobin A1C No results for input(s): HGBA1C in the last 72 hours. Fasting Lipid Panel No results for input(s): CHOL, HDL, LDLCALC, TRIG, CHOLHDL, LDLDIRECT in the last 72 hours. Thyroid Function Tests No results for input(s): TSH, T4TOTAL, T3FREE, THYROIDAB in the last 72 hours.  Invalid input(s): FREET3  Telemetry    NSR 80s currently, 1 additional episode of SVT/narrow VT ~ 2330 for approx 1 minute (personally reviewed)  Radiology    No results found.  Patient Profile     Xavier [name] a 38 y.o.malewith a h/o accessory pathway and SVT who is admitted with wide complex tachycardia and syncope.  Assessment & Plan    1. SVT vs VT EPS 8/3  showed "a wide as well as narrow QRS tachycardia with VA dissociation making a presumptive diagnosis of ventricular tachycardia." Pt sent for cMRI this am.  Dr Thom Ollinger to review and discuss with patient for plan/disposition. With VT and syncope, pt made aware of [employer] regulations stating no driving for 6 months.  For questions or updates, please contact [facility] Please consult [email or web address] for contact info under Cardiology/STEMI.  Signed, Michael Andrew Tillery, PA-C  12/07/2018, 8:45 AM   EP Attending  Patient seen and examined. I have reviewed the findings  of his EP study in detail as well as the findings of cardiac MRI. The patient has 2 potentially worrisome findings on MRI. 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 The abnormality has been associated with VT and sudden death. However the VT we induced in EP lab yesterday, did not appear to be originating or at least exiting from the LV based on LBBB QRS with delayed transition from negative to positive. In addition the narrow complex VT has to originate in the conduction system. The second finding of thinning in the base of the septum with evidence of gadolinium enhancement would be expected with the VT observed.   In light of the above, I have recommended proceeding with ICD insertion. I suspect he will eventually require AA drug therapy as well. Patients with sarcoid will often have periods where the VT is very active followed by periods, sometimes years of stability and no VT. PET scanning would certainly confirm diagnosis but in the short term would not change the treatment.   Xavier [name],M.D. 

## 2018-12-07 NOTE — Interval H&P Note (Signed)
History and Physical Interval Note:  12/07/2018 4:25 PM  Xavier [name]  has presented today for surgery, with the diagnosis of vt - syncope.  The various methods of treatment have been discussed with the patient and family. After consideration of risks, benefits and other options for treatment, the patient has consented to  Procedure(s): ICD IMPLANT (N/A) as a surgical intervention.  The patient's history has been reviewed, patient examined, no change in status, stable for surgery.  I have reviewed the patient's chart and labs.  Questions were answered to the patient's satisfaction.     Xavier [name]

## 2018-12-07 NOTE — Interval H&P Note (Signed)
History and Physical Interval Note:  12/07/2018 4:23 PM  Xavier [name]  has presented today for surgery, with the diagnosis of vt - syncope.  The various methods of treatment have been discussed with the patient and family. After consideration of risks, benefits and other options for treatment, the patient has consented to  Procedure(s): ICD IMPLANT (N/A) as a surgical intervention.  The patient's history has been reviewed, patient examined, no change in status, stable for surgery.  I have reviewed the patient's chart and labs.  Questions were answered to the patient's satisfaction.     [name]

## 2018-12-08 ENCOUNTER — Encounter: Payer: Self-pay | Admitting: Student

## 2018-12-08 ENCOUNTER — Encounter (HOSPITAL_COMMUNITY): Payer: Self-pay | Admitting: Internal Medicine

## 2018-12-08 ENCOUNTER — Inpatient Hospital Stay (HOSPITAL_COMMUNITY): Payer: BC Managed Care – PPO

## 2018-12-08 DIAGNOSIS — I472 Ventricular tachycardia: Principal | ICD-10-CM

## 2018-12-08 LAB — BASIC METABOLIC PANEL
Anion gap: 9 (ref 5–15)
BUN: 10 mg/dL (ref 6–20)
CO2: 24 mmol/L (ref 22–32)
Calcium: 9.1 mg/dL (ref 8.9–10.3)
Chloride: 103 mmol/L (ref 98–111)
Creatinine, Ser: 1.2 mg/dL (ref 0.61–1.24)
GFR calc Af Amer: >60 mL/min (ref >60)
GFR calc non Af Amer: >60 mL/min (ref >60)
Glucose, Bld: 106 mg/dL — ABNORMAL HIGH (ref 70–99)
Potassium: 4.3 mmol/L (ref 3.5–5.1)
Sodium: 136 mmol/L (ref 135–145)

## 2018-12-08 LAB — MAGNESIUM: Magnesium: 2 mg/dL (ref 1.7–2.4)

## 2018-12-08 MED ORDER — VERAPAMIL HCL 120 MG PO TABS: 120.0000 mg | ORAL_TABLET | Freq: Two times a day (BID) | ORAL | 3 refills | Status: DC

## 2018-12-08 MED ORDER — TRAMADOL HCL 50 MG PO TABS: 50.0000 mg | ORAL_TABLET | Freq: Two times a day (BID) | ORAL | 0 refills | Status: AC | PRN

## 2018-12-08 MED ORDER — VERAPAMIL HCL 120 MG PO TABS
120.0000 mg | ORAL_TABLET | Freq: Two times a day (BID) | ORAL | Status: DC
  Administered 2018-12-08: 08:00:00 120 mg via ORAL
  Filled 2018-12-08: qty 1

## 2018-12-08 NOTE — Discharge Instructions (Signed)
After Your ICD (Implantable Cardiac Defibrillator)       You have a [employer] ICD   Do not lift your arm above shoulder height for 1 week after your procedure. After 7 days, you may progress as below.     Tuesday December 14, 2018  Wednesday December 15, 2018  Thursday December 16, 2018 Friday December 17, 2018    Do not lift, push, pull, or carry anything over 10 pounds with the affected arm until Wednesday January 19, 2019      Monitor your defibrillator site for redness, swelling, and drainage. Call the device clinic at [phone] if you experience these symptoms or fever/chills.   If your incision is closed with Steri-strips or staples:  You may shower 7 days after your procedure and wash your incision with soap and water. Avoid lotions, ointments, or perfumes over your incision until it is well-healed.   You may use a hot tub or a pool AFTER your wound check appointment if the incision is completely closed.   Your ICD  is MRI compatible.   Your ICD is designed to protect you from life threatening heart rhythms. Because of this, you may receive a shock.   o 1 shock with no symptoms:  Call the office during business hours. o 1 shock with symptoms (chest pain, chest pressure, dizziness, lightheadedness, shortness of breath, overall feeling unwell):  Call 911. o If you experience 2 or more shocks in 24 hours:  Call 911. o If you receive a shock, you should not drive for 6 months per the [place] DMV IF you receive appropriate therapy from your ICD.    ICD Alerts:  Some alerts are vibratory and others beep. These are NOT emergencies. Please call our office to let us know. If this occurs at night or on weekends, it can wait until the next business day. Send a remote transmission.   If your device is capable of reading fluid status (for heart failure), you will be offered monthly monitoring to review this with you.    Remote monitoring is used to monitor your ICD from home. This  monitoring is scheduled every 91 days by our office. It allows [place] to keep an eye on the functioning of your device to ensure it is working properly. You will routinely see your Electrophysiologist annually (more often if necessary).

## 2018-12-08 NOTE — TOC Transition Note (Signed)
Transition of Care [facility]) - CM/SW Discharge Note   Patient Details  Name: Xavier [name] MRN: [id number] Date of Birth: [birth year]  [facility] CM/SW Contact:  Pollie Friar, RN Phone Number: 12/08/2018, 11:59 AM   Clinical Narrative:    Pt discharging home with self care. Pt has hospital f/u, insurance and transportation home.   Final next level of care: Home/Self Care Barriers to Discharge: No Barriers Identified   Patient Goals and CMS Choice        Discharge Placement                       Discharge Plan and Services                                     Social Determinants of Health (SDOH) Interventions     Readmission Risk Interventions No flowsheet data found.

## 2018-12-08 NOTE — Discharge Summary (Addendum)
ELECTROPHYSIOLOGY PROCEDURE DISCHARGE SUMMARY    Patient ID: Xavier [name],  MRN: [id number], DOB/AGE: 05-11-1980 38 y.o.  Admit date: 12/03/2018 Discharge date: 12/08/2018  Primary Care Physician: Leilani Able, MD  Primary Cardiologist: No primary care provider on file.  Electrophysiologist: None   Primary Diagnosis:  VT with syncope  Secondary Diagnosis: SVT Mitral Annular Disjunction Basal septal WMA + gadolinium enhancement  No Known Allergies   Procedures This Admission:  1.  Implantation of a St. Jude dual chamber ICD on 12/07/2018 by Dr. Ladona Ridgel.  The patient received a St. Jude model number [id number] ICD with model number [id number] right atrial lead, [id number] right ventricular lead. DFT's were deferred at time of implant. There were no immediate post procedure complications. 2.  CXR on 12/08/2018 demonstrated no pneumothorax status post device implantation.   Brief HPI: Xavier [name] is a 38 y.o. male was referred to electrophysiology in the outpatient setting for SVT. EPS showed VT, and thus patient was considered for  ICD implantation.  Past medical history includes above.  The patient has VT with known risk factors for sudden cardiac death. Risks, benefits, and alternatives to ICD implantation were reviewed with the patient who wished to proceed.   Hospital Course:  The patient was admitted and underwent implantation of a St. Jude dual chamber ICD with details as outlined above. They were monitored on telemetry overnight which demonstrated no arrhythmias.  Left chest was without hematoma or ecchymosis.  The device was interrogated and found to be functioning normally.  CXR was obtained and demonstrated no pneumothorax status post device implantation.  Wound care, arm mobility, and restrictions were reviewed with the patient.  The patient was examined and considered stable for discharge to home.   Physical Exam: Vitals:   12/07/18 2326 12/08/18 0726 12/08/18 0756  12/08/18 0829  BP: 104/65 (!) 142/82 118/72   Pulse: 74 79 85   Resp: 20 20    Temp: 98.1 F (36.7 C) 98 F (36.7 C)    TempSrc: Oral Oral    SpO2: 99% 99% 99% 99%  Weight:  76.5 kg    Height:        GEN- The patient is well appearing, alert and oriented x 3 today.   HEENT: normocephalic, atraumatic; sclera clear, conjunctiva pink; hearing intact; oropharynx clear; neck supple, no JVP Lymph- no cervical lymphadenopathy Lungs- Clear to ausculation bilaterally, normal work of breathing.  No wheezes, rales, rhonchi Heart- Regular rate and rhythm, no murmurs, rubs or gallops, PMI not laterally displaced GI- soft, non-tender, non-distended, bowel sounds present, no hepatosplenomegaly Extremities- no clubbing, cyanosis, or edema; DP/PT/radial pulses 2+ bilaterally MS- no significant deformity or atrophy Skin- warm and dry, no rash or lesion, left chest without hematoma/ecchymosis Psych- euthymic mood, full affect Neuro- strength and sensation are intact   Labs:   Lab Results  Component Value Date   WBC 6.3 12/03/2018   HGB 13.6 12/03/2018   HCT 42.3 12/03/2018   MCV 91.8 12/03/2018   PLT 169 12/03/2018    Recent Labs  Lab 12/03/18 2134  12/08/18 0539  NA 137   < > 136  K 4.1   < > 4.3  CL 110   < > 103  CO2 20*   < > 24  BUN 22*   < > 10  CREATININE 1.62*   < > 1.20  CALCIUM 8.4*   < > 9.1  PROT 6.6  --   --   BILITOT 1.1  --   --  ALKPHOS 41  --   --   ALT 27  --   --   AST 29  --   --   GLUCOSE 94   < > 106*   < > = values in this interval not displayed.    Discharge Medications:  Allergies as of 12/08/2018   No Known Allergies     Medication List    TAKE these medications   Advair Diskus 100-50 MCG/DOSE Aepb Generic drug: Fluticasone-Salmeterol Inhale 1 puff into the lungs Twice daily.   atorvastatin 20 MG tablet Commonly known as: LIPITOR Take 20 mg by mouth daily.   flecainide 100 MG tablet Commonly known as: TAMBOCOR TAKE 1 TABLET BY MOUTH  TWICE A DAY What changed:   how much to take  how to take this  when to take this  additional instructions   FLUoxetine 20 MG capsule Commonly known as: PROZAC Take 20 mg by mouth daily.   ibuprofen 200 MG tablet Commonly known as: ADVIL Take 400 mg by mouth every 6 (six) hours as needed for headache or moderate pain.   ProAir HFA 108 (90 Base) MCG/ACT inhaler Generic drug: albuterol Inhale 1-2 puffs into the lungs as needed for wheezing or shortness of breath (Use as directed).   traMADol 50 MG tablet Commonly known as: Ultram Take 1 tablet (50 mg total) by mouth every 12 (twelve) hours as needed for up to 5 days.   verapamil 120 MG tablet Commonly known as: CALAN Take 1 tablet (120 mg total) by mouth every 12 (twelve) hours.       Disposition:   Follow-up Information     [facility] Follow up on 12/21/2018.   Why: at 200 pm for wound check/post defib check. Contact information: [place] [address] [phone]       Evans Lance, MD Follow up on 01/28/2019.   Specialty: Cardiology Why: at 300 pm for 6 week follow up post defibrillator Contact information: [address] [address] [phone]           Duration of Discharge Encounter: Greater than 30 minutes including physician time.  Signed, Shirley Friar, PA-C  12/08/2018 9:00 AM  EP Attending  Patient seen and examined. Agree with above. The patient is doing well after ICD insertion for a wide QRS tachycardia with VA dissociation, presumed VT. He will be discharged home with his current meds including verapamil. His CXR demonstrates normal ICD/RA lead position. Interrogation under my direction demonstrates normal DDD ICD function.   Mikle Bosworth.D.

## 2018-12-15 ENCOUNTER — Telehealth: Payer: Self-pay | Admitting: *Deleted

## 2018-12-15 NOTE — Telephone Encounter (Signed)
Per Dr. Rayann Heman, restart flecainide 100mg  BID, f/u with EP APP, on 12/17/18. Spoke with patient. He is agreeable to these recommendations. Will restart flecainide today, reports he still has a supply at home. Advised to continue verapamil per Dr. Rayann Heman. Canceled wound check on 8/18 and rescheduled appointment to 8/14 at 9:00am with M. Tillery, PA. Pt is aware of office address and agrees to call back with any questions or concerns. Reiterated driving instructions, pt verbalizes understanding. No further questions at this time.      COVID-19 Pre-Screening Questions:  . In the past 7 to 10 days have you had a cough,  shortness of breath, headache, congestion, fever (100 or greater) body aches, chills, sore throat, or sudden loss of taste or sense of smell?  . Have you been around anyone with known Covid 19? Marland Kitchen Have you been around anyone who is awaiting Covid 19 test results in the past 7 to 10 days? . Have you been around anyone who has been exposed to Covid 19, or has mentioned symptoms of Covid 19 within the past 7 to 10 days?  Patient answered "no" to all questions.

## 2018-12-15 NOTE — Telephone Encounter (Signed)
Spoke with patient regarding treated VT episodes on 12/14/18. VT-1 zone episode at 22:01 treated with ATP x2, first burst of ATP accelerated VT, second burst terminated episode. VT-2 zone episode at 23:22 terminated with 30J shock x1, ATP x4 unsuccessful. VT event also noted at 19:43, device declared "NSVT" as rate fell under detection, also note morphology template match.  Presenting EGM as of 12/15/18 at 02:00 shows As/Vs rhythm @ 62bpm.  Patient denies any cardiac symptoms during episodes, reports he felt shock. Advised of [place] DMV driving restrictions x6 months. Pt verbalizes understanding. Reviewed medications--compliant with verapamil but has not been taking flecainide as he was not aware he needed to resume it post-discharge on 12/08/18. Advised I will discuss with Dr. Rayann Heman and call him back. Pt is agreeable to this plan.

## 2018-12-17 ENCOUNTER — Encounter: Payer: Self-pay | Admitting: *Deleted

## 2018-12-17 ENCOUNTER — Ambulatory Visit: Payer: BC Managed Care – PPO | Admitting: Student

## 2018-12-17 ENCOUNTER — Other Ambulatory Visit: Payer: Self-pay

## 2018-12-17 VITALS — BP 114/68 | HR 64 | Ht 68.0 in | Wt 165.0 lb

## 2018-12-17 DIAGNOSIS — I472 Ventricular tachycardia, unspecified: Secondary | ICD-10-CM

## 2018-12-17 DIAGNOSIS — R002 Palpitations: Secondary | ICD-10-CM

## 2018-12-17 DIAGNOSIS — I471 Supraventricular tachycardia: Secondary | ICD-10-CM

## 2018-12-17 LAB — CUP PACEART INCLINIC DEVICE CHECK
Battery Remaining Longevity: 103 mo
Brady Statistic RA Percent Paced: 0.29 %
Brady Statistic RV Percent Paced: 0 %
Date Time Interrogation Session: 20200814122630
HighPow Impedance: 68.625
Implantable Lead Implant Date: 20200804
Implantable Lead Implant Date: 20200804
Implantable Lead Location: 753859
Implantable Lead Location: 753860
Implantable Lead Model: 7122
Implantable Pulse Generator Implant Date: 20200804
Lead Channel Impedance Value: 575 Ohm
Lead Channel Impedance Value: 587.5 Ohm
Lead Channel Pacing Threshold Amplitude: 0.5 V
Lead Channel Pacing Threshold Amplitude: 0.75 V
Lead Channel Pacing Threshold Amplitude: 0.75 V
Lead Channel Pacing Threshold Pulse Width: 0.5 ms
Lead Channel Pacing Threshold Pulse Width: 0.5 ms
Lead Channel Pacing Threshold Pulse Width: 0.5 ms
Lead Channel Sensing Intrinsic Amplitude: 11.5 mV
Lead Channel Sensing Intrinsic Amplitude: 5 mV
Lead Channel Setting Pacing Amplitude: 0.75 V
Lead Channel Setting Pacing Amplitude: 3.5 V
Lead Channel Setting Pacing Pulse Width: 0.5 ms
Lead Channel Setting Sensing Sensitivity: 0.5 mV
Pulse Gen Serial Number: 9903314

## 2018-12-17 LAB — BASIC METABOLIC PANEL
BUN/Creatinine Ratio: 11 (ref 9–20)
BUN: 15 mg/dL (ref 6–20)
CO2: 23 mmol/L (ref 20–29)
Calcium: 9.8 mg/dL (ref 8.7–10.2)
Chloride: 102 mmol/L (ref 96–106)
Creatinine, Ser: 1.42 mg/dL — ABNORMAL HIGH (ref 0.76–1.27)
GFR calc Af Amer: 71 mL/min/{1.73_m2} (ref >59)
GFR calc non Af Amer: 62 mL/min/{1.73_m2} (ref >59)
Glucose: 92 mg/dL (ref 65–99)
Potassium: 3.9 mmol/L (ref 3.5–5.2)
Sodium: 140 mmol/L (ref 134–144)

## 2018-12-17 LAB — MAGNESIUM: Magnesium: 2.1 mg/dL (ref 1.6–2.3)

## 2018-12-17 NOTE — Progress Notes (Signed)
Electrophysiology Office Note Date: 12/17/2018  ID:  Xavier [name], DOB [birth year], MRN [id number]  PCP: Leilani Ableeese, Betti, MD Primary Cardiologist: No primary care provider on file. Electrophysiologist: None  CC: Routine ICD follow-up  Xavier [name] is a 38 y.o. male seen today for Dr. Ladona Ridgelaylor. He presents for ICD wound check and follow up.  Of note, pt had appropriate ICD therapy 12/14/2018. He had mistakenly come off of his Flecainide. He did feel his heart racing and dizziness leading up to the episodes. He has to constantly lift heavy objects at work, so has asked to be written out until his lifting restrictions are up. He denies chest pain, dyspnea, PND, orthopnea, nausea, vomiting, dizziness, syncope, edema, weight gain, or early satiety.    Device History: St. Jude Dual Chamber ICD implanted 12/07/2018 for secondary prevention of VR History of appropriate therapy: Yes History of AAD therapy: Yes (Flecainide)   Past Medical History:  Diagnosis Date  . SVT (supraventricular tachycardia) (HCC)    EPS by Dr Ladona Ridgelaylor 2007 demonstrated AP near the AV node for which ablation was deferred   Past Surgical History:  Procedure Laterality Date  . ELECTROPHYSIOLOGIC STUDY  2007   EP study by Dr Ladona Ridgelaylor revealed an apparent unusual accessory pathway for which ablation was deferred due to close proximity to the AV node  . ICD IMPLANT N/A 12/07/2018   Procedure: ICD IMPLANT;  Surgeon: Marinus Mawaylor, Gregg W, MD;  [facility];  Service: Cardiovascular;  Laterality: N/A;  . LEG SURGERY     broken leg  . STOMACH SURGERY    . SVT ABLATION N/A 12/06/2018   Procedure: SVT ABLATION;  Surgeon: Marinus Mawaylor, Gregg W, MD;  [facility];  Service: Cardiovascular;  Laterality: N/A;    Current Outpatient Medications  Medication Sig Dispense Refill  . ADVAIR DISKUS 100-50 MCG/DOSE AEPB Inhale 1 puff into the lungs Twice daily.     Marland Kitchen. atorvastatin (LIPITOR) 20 MG tablet Take 20 mg by mouth  daily.     . flecainide (TAMBOCOR) 100 MG tablet TAKE 1 TABLET BY MOUTH TWICE A DAY (Patient taking differently: Take 100 mg by mouth 2 (two) times daily. ) 60 tablet 11  . FLUoxetine (PROZAC) 20 MG capsule Take 20 mg by mouth daily.   1  . ibuprofen (ADVIL) 200 MG tablet Take 400 mg by mouth every 6 (six) hours as needed for headache or moderate pain.    Marland Kitchen. PROAIR HFA 108 (90 BASE) MCG/ACT inhaler Inhale 1-2 puffs into the lungs as needed for wheezing or shortness of breath (Use as directed).     . verapamil (CALAN) 120 MG tablet Take 1 tablet (120 mg total) by mouth every 12 (twelve) hours. 60 tablet 3   No current facility-administered medications for this visit.     Allergies:   Patient has no known allergies.   Social History: Social History   Socioeconomic History  . Marital status: Married    Spouse name: Not on file  . Number of children: 3  . Years of education: Not on file  . Highest education level: Not on file  Occupational History  . 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  Social Needs  . Financial resource strain: Not on file  . Food insecurity    Worry: Not on file    Inability: Not on file  . Transportation needs    Medical: Not on file    Non-medical: Not on file  Tobacco Use  . Smoking  status: Never Smoker  . Smokeless tobacco: Never Used  Substance and Sexual Activity  . Alcohol use: No  . Drug use: No  . Sexual activity: Not on file  Lifestyle  . Physical activity    Days per week: Not on file    Minutes per session: Not on file  . Stress: Not on file  Relationships  . Social Herbalist on phone: Not on file    Gets together: Not on file    Attends religious service: Not on file    Active member of club or organization: Not on file    Attends meetings of clubs or organizations: Not on file    Relationship status: Not on file  . Intimate partner violence    Fear of current or ex partner: Not on file    Emotionally abused: Not on file     Physically abused: Not on file    Forced sexual activity: Not on file  Other Topics Concern  . Not on file  Social History Narrative  . Not on file    Family History: Family History  Problem Relation Age of Onset  . Heart disease Maternal Grandmother     Review of Systems: All other systems reviewed and are otherwise negative except as noted above.   Physical Exam: Vitals:   12/17/18 0914  BP: 114/68  Pulse: 64  Weight: 165 lb (74.8 kg)  Height: 5\' 8"  (1.727 m)     GEN- The patient is well appearing, alert and oriented x 3 today.   HEENT: normocephalic, atraumatic; sclera clear, conjunctiva pink; hearing intact; oropharynx clear; neck supple, no JVP Lymph- no cervical lymphadenopathy Lungs- Clear to ausculation bilaterally, normal work of breathing.  No wheezes, rales, rhonchi Heart- Regular rate and rhythm, no murmurs, rubs or gallops, PMI not laterally displaced GI- soft, non-tender, non-distended, bowel sounds present, no hepatosplenomegaly Extremities- no clubbing, cyanosis, or edema; DP/PT/radial pulses 2+ bilaterally MS- no significant deformity or atrophy Skin- warm and dry, no rash or lesion; ICD pocket well healing. Steri-strips removed in clinic todau. Psych- euthymic mood, full affect Neuro- strength and sensation are intact  ICD interrogation- reviewed in detail today,  See PACEART report  EKG:  EKG is ordered today. The ekg ordered today shows NSR with 1st degree AV block, 64 bpm, PR interval 222 ms.  Recent Labs: 12/03/2018: ALT 27; B Natriuretic Peptide 98.8; Hemoglobin 13.6; Platelets 169; TSH 0.604 12/08/2018: BUN 10; Creatinine, Ser 1.20; Magnesium 2.0; Potassium 4.3; Sodium 136   Wt Readings from Last 3 Encounters:  12/17/18 165 lb (74.8 kg)  12/08/18 168 lb 10.4 oz (76.5 kg)  03/03/18 153 lb 6.4 oz (69.6 kg)     Other studies Reviewed: Additional studies/ records that were reviewed today include: Recent admission, OP note, EP study    Assessment and Plan:  1.  VT s/p ICD for secondary prevention / Recent ICD shock Continue verapamil and flecainide. He had stopped flecainide after discharge, but it has now been resumed.   Stable on an appropriate medical regimen Normal ICD function and appropriate therapy. See Pace Art report No changes today BMET and Mg today.  Re-iterated that by [facility] DMV rules he should not drive for 6 months following appropriate ICD therapy. (February 2021) Wound check performed today. Steristrips removed and wound well healing. Advised no lifting > 10 lbs until follow up in 4 weeks.   Current medicines are reviewed at length with the patient today.   The  patient does not have concerns regarding his medicines.  The following changes were made today:  none  Labs/ tests ordered today include:  Orders Placed This Encounter  Procedures  . Basic metabolic panel  . Magnesium  . CUP PACEART INCLINIC DEVICE CHECK  . EKG 12-Lead     Disposition:   Follow up with EP APP in 4 weeks. Keep 91 day post op visit with Dr. Ladona Ridgel.    Dustin Flock, PA-C  12/17/2018 3:35 PM  [facility] [address] [place] [address] [phone] (office) [phone] (fax)

## 2018-12-17 NOTE — Patient Instructions (Signed)
Medication Instructions:   Your physician recommends that you continue on your current medications as directed. Please refer to the Current Medication list given to you today.  If you need a refill on your cardiac medications before your next appointment, please call your pharmacy.   Lab work: BMET AND MAG TODAY   If you have labs (blood work) drawn today and your tests are completely normal, you will receive your results only by: Marland Kitchen MyChart Message (if you have MyChart) OR . A paper copy in the mail If you have any lab test that is abnormal or we need to change your treatment, we will call you to review the results.  Testing/Procedures: NONE ORDERED  TODAY'   Follow-Up: IN 4 WEEKS WITH TILLERY PA-C    AS SCHEDULED WITH DR Lovena Le   Any Other Special Instructions Will Be Listed Below (If Applicable).

## 2018-12-21 ENCOUNTER — Ambulatory Visit: Payer: BC Managed Care – PPO

## 2018-12-22 ENCOUNTER — Other Ambulatory Visit: Payer: Self-pay | Admitting: Internal Medicine

## 2019-01-13 NOTE — Progress Notes (Signed)
ICD check in clinic. Normal device function. Thresholds and sensing consistent with previous device measurements. Impedance trends stable over time. Pt had 1 episode of SVT 9/4, template match; no therapy. No mode switches. Histogram distribution appropriate for patient and level of activity. No changes made this session. Device programmed at appropriate safety margins. Device programmed to optimize intrinsic conduction. Estimated longevity 8 yr, 6 mo. Pt enrolled in remote follow-up. Plan to check device every 3 months remotely and in office at least annually. Pt to see Dr. Lovena Le in 2 weeks due to recent VT ablation.   Shirley Friar, PA-C  01/14/2019 11:27 AM

## 2019-01-14 ENCOUNTER — Other Ambulatory Visit: Payer: Self-pay

## 2019-01-14 ENCOUNTER — Encounter: Payer: Self-pay | Admitting: *Deleted

## 2019-01-14 ENCOUNTER — Ambulatory Visit: Payer: BC Managed Care – PPO | Admitting: Student

## 2019-01-14 DIAGNOSIS — I471 Supraventricular tachycardia: Secondary | ICD-10-CM | POA: Diagnosis not present

## 2019-01-14 LAB — CUP PACEART INCLINIC DEVICE CHECK
Battery Remaining Longevity: 102 mo
Brady Statistic RA Percent Paced: 0.23 %
Brady Statistic RV Percent Paced: 0.01 %
Date Time Interrogation Session: 20200911115658
HighPow Impedance: 67.5 Ohm
Implantable Lead Implant Date: 20200804
Implantable Lead Implant Date: 20200804
Implantable Lead Location: 753859
Implantable Lead Location: 753860
Implantable Lead Model: 7122
Implantable Pulse Generator Implant Date: 20200804
Lead Channel Impedance Value: 587.5 Ohm
Lead Channel Impedance Value: 625 Ohm
Lead Channel Pacing Threshold Amplitude: 0.5 V
Lead Channel Pacing Threshold Amplitude: 0.5 V
Lead Channel Pacing Threshold Amplitude: 0.75 V
Lead Channel Pacing Threshold Amplitude: 0.75 V
Lead Channel Pacing Threshold Pulse Width: 0.5 ms
Lead Channel Pacing Threshold Pulse Width: 0.5 ms
Lead Channel Pacing Threshold Pulse Width: 0.5 ms
Lead Channel Pacing Threshold Pulse Width: 0.5 ms
Lead Channel Sensing Intrinsic Amplitude: 11.7 mV
Lead Channel Sensing Intrinsic Amplitude: 5 mV
Lead Channel Setting Pacing Amplitude: 0.75 V
Lead Channel Setting Pacing Amplitude: 3.5 V
Lead Channel Setting Pacing Pulse Width: 0.5 ms
Lead Channel Setting Sensing Sensitivity: 0.5 mV
Pulse Gen Serial Number: 9903314

## 2019-01-14 MED ORDER — VERAPAMIL HCL 120 MG PO TABS: 120.0000 mg | ORAL_TABLET | Freq: Two times a day (BID) | ORAL | 1 refills | Status: DC

## 2019-01-14 MED ORDER — FLECAINIDE ACETATE 100 MG PO TABS: 100.0000 mg | ORAL_TABLET | Freq: Two times a day (BID) | ORAL | 1 refills | Status: DC

## 2019-01-14 NOTE — Patient Instructions (Addendum)
Medication Instructions:  Your physician recommends that you continue on your current medications as directed. Please refer to the Current Medication list given to you today.   If you need a refill on your cardiac medications before your next appointment, please call your pharmacy.   Lab work: NONE ORDERED  TODAY   If you have labs (blood work) drawn today and your tests are completely normal, you will receive your results only by: . MyChart Message (if you have MyChart) OR . A paper copy in the mail If you have any lab test that is abnormal or we need to change your treatment, we will call you to review the results.  Testing/Procedures: NONE ORDERED  TODAY  Follow-Up:   AS SCHEDULED   Any Other Special Instructions Will Be Listed Below (If Applicable).     

## 2019-01-24 ENCOUNTER — Telehealth: Payer: Self-pay | Admitting: Internal Medicine

## 2019-01-24 NOTE — Telephone Encounter (Signed)
° ° °  1. What dental office are you calling from? Dr Posey Pronto  2. What is your office phone number? [phone]  3. What is your fax number?[phone]  4. What type of procedure is the patient having performed? Routine cleaning  5. What date is procedure scheduled or is the patient there now? NOW (if the patient is at the dentist's office question goes to their cardiologist if he/she is in the office.  If not, question should go to the DOD).   6. What is your question (ex. Antibiotics prior to procedure, holding medication-we need to know how long dentist wants pt to hold med)? Is pre med needed

## 2019-01-24 NOTE — Telephone Encounter (Signed)
Returned call to dentist office.  Advised no premeds needed for routine cleaning, but usually have Pt's wait 10 weeks after implant before routine cleaning.  Office thanked nurse for return call.

## 2019-01-28 ENCOUNTER — Encounter: Payer: Self-pay | Admitting: Internal Medicine

## 2019-01-28 ENCOUNTER — Telehealth: Payer: Self-pay | Admitting: Emergency Medicine

## 2019-01-28 ENCOUNTER — Ambulatory Visit (INDEPENDENT_AMBULATORY_CARE_PROVIDER_SITE_OTHER): Payer: BC Managed Care – PPO | Admitting: Internal Medicine

## 2019-01-28 ENCOUNTER — Other Ambulatory Visit: Payer: Self-pay

## 2019-01-28 VITALS — BP 108/74 | HR 62 | Ht 68.0 in | Wt 173.0 lb

## 2019-01-28 DIAGNOSIS — Z95810 Presence of automatic (implantable) cardiac defibrillator: Secondary | ICD-10-CM

## 2019-01-28 DIAGNOSIS — R55 Syncope and collapse: Secondary | ICD-10-CM

## 2019-01-28 DIAGNOSIS — I471 Supraventricular tachycardia: Secondary | ICD-10-CM

## 2019-01-28 NOTE — Patient Instructions (Signed)
Medication Instructions:  Your physician recommends that you continue on your current medications as directed. Please refer to the Current Medication list given to you today.  Labwork: None ordered.  Testing/Procedures: None ordered.  Follow-Up: Your physician wants you to follow-up in: 9 months with Dr. Lovena Le.   You will receive a reminder letter in the mail two months in advance. If you don't receive a letter, please call our office to schedule the follow-up appointment.  Remote monitoring is used to monitor your ICD from home. This monitoring reduces the number of office visits required to check your device to one time per year. It allows [place] to keep an eye on the functioning of your device to ensure it is working properly. You are scheduled for a device check from home on 03/09/2019. You may send your transmission at any time that day. If you have a wireless device, the transmission will be sent automatically. After your physician reviews your transmission, you will receive a postcard with your next transmission date.  Any Other Special Instructions Will Be Listed Below (If Applicable).  If you need a refill on your cardiac medications before your next appointment, please call your pharmacy.

## 2019-01-28 NOTE — Progress Notes (Signed)
HPI Xavier [name] returns today for followup of his recurrent ventricular arrhythmias. He is a pleasant 38 yo man with syncope and recurrent wide and narrow QRS tachycardia. He underwent EP study several months ago after presenting with syncope and was found to have both wide and narrow QRS tachycardia with VA dissociation. His EF is normal. A cardiac MRI was obtained which demonstrated hypokinesis in the base of the LV septun with wall thinning as well as delayed subendocardial enhancement of the RV superior RV insertion point, suggestive a cardiac sarcoid. For this he underwent ICD insertion. In the interim, he has had one ICD shock. He has multiple episodes of presumed VT with successful ATP. During the EP study at times his VA conduction was one to one.  Not on File   Current Outpatient Medications  Medication Sig Dispense Refill  . ADVAIR DISKUS 100-50 MCG/DOSE AEPB Inhale 1 puff into the lungs Twice daily.     Marland Kitchen atorvastatin (LIPITOR) 20 MG tablet Take 20 mg by mouth daily.     . flecainide (TAMBOCOR) 100 MG tablet Take 1 tablet (100 mg total) by mouth 2 (two) times daily. 180 tablet 1  . FLUoxetine (PROZAC) 20 MG capsule Take 20 mg by mouth daily.   1  . ibuprofen (ADVIL) 200 MG tablet Take 400 mg by mouth every 6 (six) hours as needed for headache or moderate pain.    Marland Kitchen PROAIR HFA 108 (90 BASE) MCG/ACT inhaler Inhale 1-2 puffs into the lungs as needed for wheezing or shortness of breath (Use as directed).     . verapamil (CALAN) 120 MG tablet Take 1 tablet (120 mg total) by mouth every 12 (twelve) hours. 180 tablet 1   No current facility-administered medications for this visit.      Past Medical History:  Diagnosis Date  . SVT (supraventricular tachycardia) (HCC)    EPS by Dr Ladona Ridgel 2007 demonstrated AP near the AV node for which ablation was deferred    ROS:   All systems reviewed and negative except as noted in the HPI.   Past Surgical History:  Procedure Laterality  Date  . ELECTROPHYSIOLOGIC STUDY  2007   EP study by Dr Ladona Ridgel revealed an apparent unusual accessory pathway for which ablation was deferred due to close proximity to the AV node  . ICD IMPLANT N/A 12/07/2018   Procedure: ICD IMPLANT;  Surgeon: Marinus Maw, MD;  [facility]: [facility] INVASIVE CV LAB;  Service: Cardiovascular;  Laterality: N/A;  . LEG SURGERY     broken leg  . STOMACH SURGERY    . SVT ABLATION N/A 12/06/2018   Procedure: SVT ABLATION;  Surgeon: Marinus Maw, MD;  Location: [facility] INVASIVE CV LAB;  Service: Cardiovascular;  Laterality: N/A;     Family History  Problem Relation Age of Onset  . Heart disease Maternal Grandmother      Social History   Socioeconomic History  . Marital status: Married    Spouse name: Not on file  . Number of children: 3  . Years of education: Not on file  . Highest education level: Not on file  Occupational History  . 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 Financial resource strain: Not on file  . Food insecurity    Worry: Not on file    Inability: Not on file  . Transportation needs    Medical: Not on file    Non-medical: Not on file  Tobacco Use  . Smoking  status: Never Smoker  . Smokeless tobacco: Never Used  Substance and Sexual Activity  . Alcohol use: No  . Drug use: No  . Sexual activity: Not on file  Lifestyle  . Physical activity    Days per week: Not on file    Minutes per session: Not on file  . Stress: Not on file  Relationships  . Social Herbalist on phone: Not on file    Gets together: Not on file    Attends religious service: Not on file    Active member of club or organization: Not on file    Attends meetings of clubs or organizations: Not on file    Relationship status: Not on file  . Intimate partner violence    Fear of current or ex partner: Not on file    Emotionally abused: Not on file    Physically abused: Not on file    Forced sexual activity: Not on file  Other Topics Concern   . Not on file  Social History Narrative  . Not on file     BP 108/74   Pulse 62   Ht 5\' 8"  (1.727 m)   Wt 173 lb (78.5 kg)   SpO2 99%   BMI 26.30 kg/m   Physical Exam:  Well appearing NAD HEENT: Unremarkable Neck:  No JVD, no thyromegally Lymphatics:  No adenopathy Back:  No CVA tenderness Lungs:  Clear with no wheezes HEART:  Regular rate rhythm, no murmurs, no rubs, no clicks Abd:  soft, positive bowel sounds, no organomegally, no rebound, no guarding Ext:  2 plus pulses, no edema, no cyanosis, no clubbing Skin:  No rashes no nodules Neuro:  CN II through XII intact, motor grossly intact  EKG - NSR with first degree AV block and IVCD QRS 122  DEVICE  Normal device function.  See PaceArt for details.   Assess/Plan: 1. Wide QRS tachycardia, most likely VT - He will continue flecainide and verapamil for now. We discussed possible catheter ablation. For now he will continue medical therapy.  2. ICD - his St. Jude DDD ICD is working normally.  3. Possible cardiac sarcoid - no indication for additional therapy. He does not have symptoms of lung involvement. I reviewed a CT scan from 2007 which demonstrated a single enlarged axillary node.  Xavier [name].D.

## 2019-01-28 NOTE — Telephone Encounter (Signed)
Patient felt dizzy  During episode of VT 01/27/19 @ 9:36 pm. No CP or SOB with event  Treated with ATP successfully. Had episode of NSVT on 01/05/19 @ 11:02 pm. Reports no missed doses of Flecanide. Patient has appointment with Dr Lovena Le today at 3:00 pm. Driving restriction per  DMV due to [place] by device reviewed and patient acknowledged restrictions.

## 2019-03-09 ENCOUNTER — Ambulatory Visit (INDEPENDENT_AMBULATORY_CARE_PROVIDER_SITE_OTHER): Payer: BC Managed Care – PPO | Admitting: *Deleted

## 2019-03-09 DIAGNOSIS — R55 Syncope and collapse: Secondary | ICD-10-CM

## 2019-03-09 DIAGNOSIS — I471 Supraventricular tachycardia: Secondary | ICD-10-CM

## 2019-03-09 LAB — CUP PACEART REMOTE DEVICE CHECK
Battery Remaining Longevity: 86 mo
Battery Remaining Percentage: 95 %
Battery Voltage: 3.19 V
Brady Statistic AP VP Percent: 1 %
Brady Statistic AP VS Percent: 1 %
Brady Statistic AS VP Percent: 1 %
Brady Statistic AS VS Percent: 99 %
Brady Statistic RA Percent Paced: 1 %
Brady Statistic RV Percent Paced: 1 %
Date Time Interrogation Session: 20201104070038
HighPow Impedance: 62 Ohm
HighPow Impedance: 62 Ohm
Implantable Lead Implant Date: 20200804
Implantable Lead Implant Date: 20200804
Implantable Lead Location: 753859
Implantable Lead Location: 753860
Implantable Lead Model: 7122
Implantable Pulse Generator Implant Date: 20200804
Lead Channel Impedance Value: 540 Ohm
Lead Channel Impedance Value: 580 Ohm
Lead Channel Pacing Threshold Amplitude: 0.5 V
Lead Channel Pacing Threshold Amplitude: 0.75 V
Lead Channel Pacing Threshold Pulse Width: 0.5 ms
Lead Channel Pacing Threshold Pulse Width: 0.5 ms
Lead Channel Sensing Intrinsic Amplitude: 12 mV
Lead Channel Sensing Intrinsic Amplitude: 5 mV
Lead Channel Setting Pacing Amplitude: 3.5 V
Lead Channel Setting Pacing Amplitude: 3.5 V
Lead Channel Setting Pacing Pulse Width: 0.5 ms
Lead Channel Setting Sensing Sensitivity: 0.5 mV
Pulse Gen Serial Number: 9903314

## 2019-03-27 NOTE — Progress Notes (Signed)
Remote ICD transmission.   

## 2019-04-25 ENCOUNTER — Telehealth: Payer: Self-pay | Admitting: Emergency Medicine

## 2019-04-25 NOTE — Telephone Encounter (Addendum)
Received alert that on 04/22/19 the patient had 6 episodes of NSVT, 2  VT episodes treated with ATP successfully X 1, and 2 episodes of VT that required a 30 J shock to terminate the VT. The events occurred from 2028 - 2330 on 04/22/19 and the patient reports he was asleep and was awakened by the last shock delivered at 2202.  No symptoms prior to going to sleep on 04/22/19 or after receiving shocks. Confirmed that he has not missed any doses of flecainide. Shock plan reviewed with patient and ED precautions given. Will forward to Dr Lovena Le for recommendations.

## 2019-04-26 ENCOUNTER — Telehealth: Payer: Self-pay

## 2019-04-26 NOTE — Telephone Encounter (Signed)
Pt called wanting to talk with the nurse about the shocks he got on Friday. I told him Dr. Lovena Le have not reviewed his information yet. I let him know if dr. Lovena Le decides to make changes the nurse will call him back. If no changes are made then he will not get a phone call.

## 2019-04-26 NOTE — Telephone Encounter (Signed)
See phone note opened 04/25/19.

## 2019-04-26 NOTE — Telephone Encounter (Signed)
Per Dr. Sherron Ales Pt f/u in February to discuss ablation vs med changes  Appt made.  Will forward to device for follow up.

## 2019-04-26 NOTE — Telephone Encounter (Addendum)
Spoke with patient. Advised of f/u recommendations. Pt is agreeable to f/u in 06/2019 with Dr. Lovena Le, though he requests morning appointment if possible. Will forward to scheduler for assistance. Pt denies additional questions at this time.

## 2019-04-26 NOTE — Telephone Encounter (Signed)
LMOVM requesting call back to DC. Direct number provided. No alert transmissions received since 04/23/19.

## 2019-04-27 NOTE — Telephone Encounter (Signed)
See phone note from 04/26/19.

## 2019-05-16 LAB — CUP PACEART INCLINIC DEVICE CHECK
Date Time Interrogation Session: 20200925155004
Implantable Lead Implant Date: 20200804
Implantable Lead Implant Date: 20200804
Implantable Lead Location: 753859
Implantable Lead Location: 753860
Implantable Lead Model: 7122
Implantable Pulse Generator Implant Date: 20200804
Pulse Gen Serial Number: 9903314

## 2019-06-06 ENCOUNTER — Ambulatory Visit (INDEPENDENT_AMBULATORY_CARE_PROVIDER_SITE_OTHER): Payer: BC Managed Care – PPO | Admitting: Internal Medicine

## 2019-06-06 ENCOUNTER — Encounter: Payer: BC Managed Care – PPO | Admitting: Internal Medicine

## 2019-06-06 ENCOUNTER — Other Ambulatory Visit: Payer: Self-pay

## 2019-06-06 VITALS — BP 114/82 | HR 58 | Ht 68.0 in | Wt 175.0 lb

## 2019-06-06 DIAGNOSIS — Z95810 Presence of automatic (implantable) cardiac defibrillator: Secondary | ICD-10-CM | POA: Diagnosis not present

## 2019-06-06 DIAGNOSIS — I472 Ventricular tachycardia, unspecified: Secondary | ICD-10-CM

## 2019-06-06 DIAGNOSIS — R55 Syncope and collapse: Secondary | ICD-10-CM

## 2019-06-06 MED ORDER — METOPROLOL TARTRATE 50 MG PO TABS: 50.0000 mg | ORAL_TABLET | Freq: Two times a day (BID) | ORAL | 3 refills | Status: DC

## 2019-06-06 NOTE — Progress Notes (Signed)
HPI Xavier [name] returns today for followup. He has a h/o wide and narrow QRS tachy and syncope. He underwent MRI after his EP study and was found to have scar in the base of the septum. He underwent DDD ICD insertion. He was well when I saw him in November but did have a shock on 04/22/19. He denies medical non-compliance. He denies chest pain or sob.  No Known Allergies   Current Outpatient Medications  Medication Sig Dispense Refill  . ADVAIR DISKUS 100-50 MCG/DOSE AEPB Inhale 1 puff into the lungs Twice daily.     Marland Kitchen atorvastatin (LIPITOR) 20 MG tablet Take 20 mg by mouth daily.     . flecainide (TAMBOCOR) 100 MG tablet Take 1 tablet (100 mg total) by mouth 2 (two) times daily. 180 tablet 1  . FLUoxetine (PROZAC) 20 MG capsule Take 20 mg by mouth daily.   1  . ibuprofen (ADVIL) 200 MG tablet Take 400 mg by mouth every 6 (six) hours as needed for headache or moderate pain.    Marland Kitchen PROAIR HFA 108 (90 BASE) MCG/ACT inhaler Inhale 1-2 puffs into the lungs as needed for wheezing or shortness of breath (Use as directed).     . verapamil (CALAN) 120 MG tablet Take 1 tablet (120 mg total) by mouth every 12 (twelve) hours. 180 tablet 1   No current facility-administered medications for this visit.     Past Medical History:  Diagnosis Date  . SVT (supraventricular tachycardia) (HCC)    EPS by Dr Ladona Ridgel 2007 demonstrated AP near the AV node for which ablation was deferred    ROS:   All systems reviewed and negative except as noted in the HPI.   Past Surgical History:  Procedure Laterality Date  . ELECTROPHYSIOLOGIC STUDY  2007   EP study by Dr Ladona Ridgel revealed an apparent unusual accessory pathway for which ablation was deferred due to close proximity to the AV node  . ICD IMPLANT N/A 12/07/2018   Procedure: ICD IMPLANT;  Surgeon: Marinus Maw, MD;  Location: [facility] INVASIVE CV LAB;  Service: Cardiovascular;  Laterality: N/A;  . LEG SURGERY     broken leg  . STOMACH SURGERY    . SVT  ABLATION N/A 12/06/2018   Procedure: SVT ABLATION;  Surgeon: Marinus Maw, MD;  Location: [facility] INVASIVE CV LAB;  Service: Cardiovascular;  Laterality: N/A;     Family History  Problem Relation Age of Onset  . Heart disease Maternal Grandmother      Social History   Socioeconomic History  . Marital status: Married    Spouse name: Not on file  . Number of children: 3  . Years of education: Not on file  . Highest education level: Not on file  Occupational History  . 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 Smoking status: Never Smoker  . Smokeless tobacco: Never Used  Substance and Sexual Activity  . Alcohol use: No  . Drug use: No  . Sexual activity: Not on file  Other Topics Concern  . Not on file  Social History Narrative  . Not on file   Social Determinants of Health   Financial Resource Strain:   . Difficulty of Paying Living Expenses: Not on file  Food Insecurity:   . Worried About Programme researcher, broadcasting/film/video in the Last Year: Not on file  . Ran Out of Food in the Last Year: Not on file  Transportation Needs:   . Lack  of Transportation (Medical): Not on file  . Lack of Transportation (Non-Medical): Not on file  Physical Activity:   . Days of Exercise per Week: Not on file  . Minutes of Exercise per Session: Not on file  Stress:   . Feeling of Stress : Not on file  Social Connections:   . Frequency of Communication with Friends and Family: Not on file  . Frequency of Social Gatherings with Friends and Family: Not on file  . Attends Religious Services: Not on file  . Active Member of Clubs or Organizations: Not on file  . Attends Archivist Meetings: Not on file  . Marital Status: Not on file  Intimate Partner Violence:   . Fear of Current or Ex-Partner: Not on file  . Emotionally Abused: Not on file  . Physically Abused: Not on file  . Sexually Abused: Not on file     BP 114/82   Pulse (!) 58   Ht 5\' 8"  (1.727 m)   Wt 175 lb (79.4 kg)   BMI  26.61 kg/m   Physical Exam:  Well appearing NAD HEENT: Unremarkable Neck:  No JVD, no thyromegally Lymphatics:  No adenopathy Back:  No CVA tenderness Lungs:  Clear with no wheezes HEART:  Regular rate rhythm, no murmurs, no rubs, no clicks Abd:  soft, positive bowel sounds, no organomegally, no rebound, no guarding Ext:  2 plus pulses, no edema, no cyanosis, no clubbing Skin:  No rashes no nodules Neuro:  CN II through XII intact, motor grossly intact  EKG - NSR with first degree AV block  DEVICE  Normal device function.  See PaceArt for details.   Assess/Plan: 1. VT - the patient has had recurrent VT with 3 different rates including ventricular flutter at 300/min for which he was successfully shocked. He has had some successful ATP as well. He was sleeping when this episode occurred. In my mind the issue of VT vs nodoventricular tachycardia is settled. Today I asked him to stop verapamil and switch to metoprolol and flecainide.  2. Conduction system disease - he has had first degree AV block. He previously have IRBBB and a PR of 180. Now PR is 270 which goes along with his underlying illness.  3. Infiltrative CM - his MR demonstrated a small amount of mid myocardial delayed enhancement and thinning in the base of the septum and on the RV side near superior insertion point. He has no clinical evidence of other organ system involvement.   I spent 25 minutes including 50% face to face time.   Mikle Bosworth.D.

## 2019-06-06 NOTE — Patient Instructions (Addendum)
Medication Instructions:  Your physician has recommended you make the following change in your medication:   1.  Stop VERAPAMIL  2.  Start taking metoprolol tartrate 50 mg--Take 1/2 tablet by mouth twice a day for 2 weeks.  After 2 weeks-increase to 1 tablet by mouth twice a day   Labwork: None ordered.  Testing/Procedures: None ordered.  Follow-Up: Your physician wants you to follow-up in: 6 months with Dr. Ladona Ridgel.   You will receive a reminder letter in the mail two months in advance. If you don't receive a letter, please call our office to schedule the follow-up appointment.  Remote monitoring is used to monitor your ICD from home. This monitoring reduces the number of office visits required to check your device to one time per year. It allows [place] to keep an eye on the functioning of your device to ensure it is working properly. You are scheduled for a device check from home on 06/16/2019. You may send your transmission at any time that day. If you have a wireless device, the transmission will be sent automatically. After your physician reviews your transmission, you will receive a postcard with your next transmission date.  Any Other Special Instructions Will Be Listed Below (If Applicable).  If you need a refill on your cardiac medications before your next appointment, please call your pharmacy.   Metoprolol Tablets What is this medicine? METOPROLOL (me TOE proe lole) is a beta blocker. It decreases the amount of work your heart has to do and helps your heart beat regularly. It is used to treat high blood pressure and/or prevent chest pain (also called angina). It is also used after a heart attack to prevent a second one. This medicine may be used for other purposes; ask your health care provider or pharmacist if you have questions. COMMON BRAND NAME(S): Lopressor What should I tell my health care provider before I take this medicine? They need to know if you have any of these  conditions:  diabetes  heart or vessel disease like slow heart rate, worsening heart failure, heart block, sick sinus syndrome or Raynaud's disease  kidney disease  liver disease  lung or breathing disease, like asthma or emphysema  pheochromocytoma  thyroid disease  an unusual or allergic reaction to metoprolol, other beta-blockers, medicines, foods, dyes, or preservatives  pregnant or trying to get pregnant  breast-feeding How should I use this medicine? Take this drug by mouth with water. Take it as directed on the prescription label at the same time every day. You can take it with or without food. You should always take it the same way. Keep taking it unless your health care provider tells you to stop. Talk to your health care provider about the use of this drug in children. Special care may be needed. Overdosage: If you think you have taken too much of this medicine contact a poison control center or emergency room at once. NOTE: This medicine is only for you. Do not share this medicine with others. What if I miss a dose? If you miss a dose, take it as soon as you can. If it is almost time for your next dose, take only that dose. Do not take double or extra doses. What may interact with this medicine? This medicine may interact with the following medications:  certain medicines for blood pressure, heart disease, irregular heart beat  certain medicines for depression like monoamine oxidase (MAO) inhibitors, fluoxetine, or paroxetine  clonidine  dobutamine  epinephrine  isoproterenol  reserpine This list may not describe all possible interactions. Give your health care provider a list of all the medicines, herbs, non-prescription drugs, or dietary supplements you use. Also tell them if you smoke, drink alcohol, or use illegal drugs. Some items may interact with your medicine. What should I watch for while using this medicine? Visit your doctor or health care  professional for regular check ups. Contact your doctor right away if your symptoms worsen. Check your blood pressure and pulse rate regularly. Ask your health care professional what your blood pressure and pulse rate should be, and when you should contact them. You may get drowsy or dizzy. Do not drive, use machinery, or do anything that needs mental alertness until you know how this medicine affects you. Do not sit or stand up quickly, especially if you are an older patient. This reduces the risk of dizzy or fainting spells. Contact your doctor if these symptoms continue. Alcohol may interfere with the effect of this medicine. Avoid alcoholic drinks. This medicine may increase blood sugar. Ask your healthcare provider if changes in diet or medicines are needed if you have diabetes. What side effects may I notice from receiving this medicine? Side effects that you should report to your doctor or health care professional as soon as possible:  allergic reactions like skin rash, itching or hives  cold or numb hands or feet  depression  difficulty breathing  faint  fever with sore throat  irregular heartbeat, chest pain  rapid weight gain   signs and symptoms of high blood sugar such as being more thirsty or hungry or having to urinate more than normal. You may also feel very tired or have blurry vision.  swollen legs or ankles Side effects that usually do not require medical attention (report to your doctor or health care professional if they continue or are bothersome):  anxiety or nervousness  change in sex drive or performance  dry skin  headache  nightmares or trouble sleeping  short term memory loss  stomach upset or diarrhea This list may not describe all possible side effects. Call your doctor for medical advice about side effects. You may report side effects to FDA at 1-800-FDA-1088. Where should I keep my medicine? Keep out of the reach of children and pets. Store  at room temperature between 15 and 30 degrees C (59 and 86 degrees F). Protect from moisture. Keep the container tightly closed. Throw away any unused drug after the expiration date. NOTE: This sheet is a summary. It may not cover all possible information. If you have questions about this medicine, talk to your doctor, pharmacist, or health care provider.  2020 Elsevier/Gold Standard (2018-12-02 17:21:17)

## 2019-06-16 ENCOUNTER — Ambulatory Visit (INDEPENDENT_AMBULATORY_CARE_PROVIDER_SITE_OTHER): Payer: BC Managed Care – PPO | Admitting: *Deleted

## 2019-06-16 DIAGNOSIS — I471 Supraventricular tachycardia: Secondary | ICD-10-CM | POA: Diagnosis not present

## 2019-06-16 LAB — CUP PACEART REMOTE DEVICE CHECK
Battery Remaining Longevity: 94 mo
Battery Remaining Percentage: 92 %
Battery Voltage: 3.14 V
Brady Statistic AP VP Percent: 1 %
Brady Statistic AP VS Percent: 1 %
Brady Statistic AS VP Percent: 1 %
Brady Statistic AS VS Percent: 99 %
Brady Statistic RA Percent Paced: 1 %
Brady Statistic RV Percent Paced: 1 %
Date Time Interrogation Session: 20210211045834
HighPow Impedance: 63 Ohm
HighPow Impedance: 63 Ohm
Implantable Lead Implant Date: 20200804
Implantable Lead Implant Date: 20200804
Implantable Lead Location: 753859
Implantable Lead Location: 753860
Implantable Lead Model: 7122
Implantable Pulse Generator Implant Date: 20200804
Lead Channel Impedance Value: 540 Ohm
Lead Channel Impedance Value: 550 Ohm
Lead Channel Pacing Threshold Amplitude: 0.75 V
Lead Channel Pacing Threshold Amplitude: 0.75 V
Lead Channel Pacing Threshold Pulse Width: 0.5 ms
Lead Channel Pacing Threshold Pulse Width: 0.5 ms
Lead Channel Sensing Intrinsic Amplitude: 11.6 mV
Lead Channel Sensing Intrinsic Amplitude: 5 mV
Lead Channel Setting Pacing Amplitude: 2 V
Lead Channel Setting Pacing Amplitude: 2.5 V
Lead Channel Setting Pacing Pulse Width: 0.5 ms
Lead Channel Setting Sensing Sensitivity: 0.5 mV
Pulse Gen Serial Number: 9903314

## 2019-06-16 NOTE — Progress Notes (Signed)
ICD Remote  

## 2019-06-27 ENCOUNTER — Telehealth: Payer: Self-pay | Admitting: Emergency Medicine

## 2019-06-27 NOTE — Telephone Encounter (Addendum)
Received alert for episode of NSVT on 06/24/19 @ 2116 that lasted 24 seconds and an alert for 06/24/19 @ 2119 that showed episode of what appears to be VT that fell in the VT 1 zone at 181 bpm that was successfully treated by ATP x 1. Patient reports he was asymptomatic with both episodes. Was started on flecanide 100 mg BID  and metoprolol 25 mg bid  at last visit  with metoprolol dose to be increased today to 50 mg BID per Dr Lubertha Basque office note on 06/06/19.  Shock plan  and [place] driving restrictions reviewed with patient.

## 2019-06-29 NOTE — Telephone Encounter (Signed)
Remote reviewed. Continue current meds. GT

## 2019-07-07 ENCOUNTER — Other Ambulatory Visit: Payer: Self-pay

## 2019-07-07 ENCOUNTER — Ambulatory Visit (INDEPENDENT_AMBULATORY_CARE_PROVIDER_SITE_OTHER): Payer: BC Managed Care – PPO

## 2019-07-07 VITALS — HR 49 | Ht 68.0 in

## 2019-07-07 DIAGNOSIS — I472 Ventricular tachycardia, unspecified: Secondary | ICD-10-CM

## 2019-07-07 NOTE — Progress Notes (Signed)
1.) Reason for visit: med changes  2.) Name of MD requesting visit: Dr. Ladona Ridgel  3.) H&P: med change/ VT with ICD  4.) ROS related to problem:  Changed from verapamil to metoprolol  5.) Assessment and plan per MD: Per Dr. Lenon Oms changes today.  F/u in 3 months.

## 2019-07-07 NOTE — Patient Instructions (Addendum)
Medication Instructions:  Your physician recommends that you continue on your current medications as directed. Please refer to the Current Medication list given to you today.  Labwork: None ordered.  Testing/Procedures: None ordered.  Follow-Up: Your physician wants you to follow-up in: 3 months with Dr. Ladona Ridgel.     October 11, 2019 at 2:30 pm  Remote monitoring is used to monitor your ICD from home. This monitoring reduces the number of office visits required to check your device to one time per year. It allows [place] to keep an eye on the functioning of your device to ensure it is working properly. You are scheduled for a device check from home on 09/15/2019. You may send your transmission at any time that day. If you have a wireless device, the transmission will be sent automatically. After your physician reviews your transmission, you will receive a postcard with your next transmission date.  Any Other Special Instructions Will Be Listed Below (If Applicable).  If you need a refill on your cardiac medications before your next appointment, please call your pharmacy.

## 2019-07-30 ENCOUNTER — Ambulatory Visit: Payer: BC Managed Care – PPO | Attending: Family Medicine

## 2019-07-30 DIAGNOSIS — Z23 Encounter for immunization: Secondary | ICD-10-CM

## 2019-07-30 NOTE — Progress Notes (Signed)
   Covid-19 Vaccination Clinic  Name:  Xavier [name]    MRN: [id number] DOB: May 21, 1980  07/30/2019  [name] was observed post Covid-19 immunization for 15 minutes without incident. He was provided with Vaccine Information Sheet and instruction to access the V-Safe system.   [name] was instructed to call 911 with any severe reactions post vaccine: Marland Kitchen Difficulty breathing  . Swelling of face and throat  . A fast heartbeat  . A bad rash all over body  . Dizziness and weakness   Immunizations Administered    Name Date Dose VIS Date Route   Pfizer COVID-19 Vaccine 07/30/2019  5:01 PM 0.3 mL 04/15/2019 Intramuscular   Manufacturer: [employer], [employer]   Lot: [id number]   NDC: [address]-3

## 2019-08-23 ENCOUNTER — Ambulatory Visit: Payer: BC Managed Care – PPO | Attending: Internal Medicine

## 2019-08-23 DIAGNOSIS — Z23 Encounter for immunization: Secondary | ICD-10-CM

## 2019-08-23 NOTE — Progress Notes (Signed)
   Covid-19 Vaccination Clinic  Name:  Xavier [name]    MRN: [id number] DOB: [birth year]  08/23/2019  [name] was observed post Covid-19 immunization for 15 minutes without incident. He was provided with Vaccine Information Sheet and instruction to access the V-Safe system.   [name] was instructed to call 911 with any severe reactions post vaccine: Marland Kitchen Difficulty breathing  . Swelling of face and throat  . A fast heartbeat  . A bad rash all over body  . Dizziness and weakness   Immunizations Administered    Name Date Dose VIS Date Route   Pfizer COVID-19 Vaccine 08/23/2019 11:39 AM 0.3 mL 06/29/2018 Intramuscular   Manufacturer: [employer], [employer]   Lot: [id number]   NDC: [address]-1

## 2019-08-31 ENCOUNTER — Other Ambulatory Visit: Payer: Self-pay | Admitting: Student

## 2019-08-31 MED ORDER — FLECAINIDE ACETATE 100 MG PO TABS: 100.0000 mg | ORAL_TABLET | Freq: Two times a day (BID) | ORAL | 3 refills | Status: DC

## 2019-08-31 NOTE — Telephone Encounter (Signed)
Outpatient Medication Detail   Disp Refills Start End   flecainide (TAMBOCOR) 100 MG tablet 180 tablet 3 08/31/2019    Sig - Route: Take 1 tablet (100 mg total) by mouth 2 (two) times daily. - Oral   Sent to pharmacy as: flecainide (TAMBOCOR) 100 MG tablet   E-Prescribing Status: Receipt confirmed by pharmacy (08/31/2019 12:19 PM EDT)   Pharmacy  [id number] - [place], [place] - [address]

## 2019-09-15 ENCOUNTER — Ambulatory Visit (INDEPENDENT_AMBULATORY_CARE_PROVIDER_SITE_OTHER): Payer: BC Managed Care – PPO | Admitting: *Deleted

## 2019-09-15 DIAGNOSIS — I472 Ventricular tachycardia, unspecified: Secondary | ICD-10-CM

## 2019-09-15 LAB — CUP PACEART REMOTE DEVICE CHECK
Battery Remaining Longevity: 93 mo
Battery Remaining Percentage: 91 %
Battery Voltage: 3.11 V
Brady Statistic AP VP Percent: 1 %
Brady Statistic AP VS Percent: 1 %
Brady Statistic AS VP Percent: 1 %
Brady Statistic AS VS Percent: 99 %
Brady Statistic RA Percent Paced: 1 %
Brady Statistic RV Percent Paced: 1 %
Date Time Interrogation Session: 20210513063028
HighPow Impedance: 73 Ohm
HighPow Impedance: 73 Ohm
Implantable Lead Implant Date: 20200804
Implantable Lead Implant Date: 20200804
Implantable Lead Location: 753859
Implantable Lead Location: 753860
Implantable Lead Model: 7122
Implantable Pulse Generator Implant Date: 20200804
Lead Channel Impedance Value: 550 Ohm
Lead Channel Impedance Value: 560 Ohm
Lead Channel Pacing Threshold Amplitude: 0.75 V
Lead Channel Pacing Threshold Amplitude: 0.75 V
Lead Channel Pacing Threshold Pulse Width: 0.5 ms
Lead Channel Pacing Threshold Pulse Width: 0.5 ms
Lead Channel Sensing Intrinsic Amplitude: 12 mV
Lead Channel Sensing Intrinsic Amplitude: 5 mV
Lead Channel Setting Pacing Amplitude: 2 V
Lead Channel Setting Pacing Amplitude: 2.5 V
Lead Channel Setting Pacing Pulse Width: 0.5 ms
Lead Channel Setting Sensing Sensitivity: 0.5 mV
Pulse Gen Serial Number: 9903314

## 2019-09-19 NOTE — Progress Notes (Signed)
Remote ICD transmission.   

## 2019-10-11 ENCOUNTER — Ambulatory Visit (INDEPENDENT_AMBULATORY_CARE_PROVIDER_SITE_OTHER): Payer: BC Managed Care – PPO | Admitting: Internal Medicine

## 2019-10-11 ENCOUNTER — Other Ambulatory Visit: Payer: Self-pay

## 2019-10-11 ENCOUNTER — Encounter: Payer: BC Managed Care – PPO | Admitting: Internal Medicine

## 2019-10-11 ENCOUNTER — Encounter: Payer: Self-pay | Admitting: Internal Medicine

## 2019-10-11 VITALS — BP 122/64 | HR 54 | Ht 68.0 in | Wt 174.8 lb

## 2019-10-11 DIAGNOSIS — I429 Cardiomyopathy, unspecified: Secondary | ICD-10-CM | POA: Diagnosis not present

## 2019-10-11 DIAGNOSIS — I472 Ventricular tachycardia, unspecified: Secondary | ICD-10-CM

## 2019-10-11 DIAGNOSIS — Z95810 Presence of automatic (implantable) cardiac defibrillator: Secondary | ICD-10-CM

## 2019-10-11 DIAGNOSIS — I428 Other cardiomyopathies: Secondary | ICD-10-CM

## 2019-10-11 NOTE — Progress Notes (Signed)
HPI [name] returns today for followup. He has a h/o wide and narrow QRS tachy and syncope. He underwent MRI after his EP study and was found to have scar in the base of the septum. He underwent DDD ICD insertion. He has been well with no recurrent ICD shocks. He has had some ATP. His VT is VA dissociated. He has developed progressive conductions system disease. He denies medical non-compliance.   .No Known Allergies   Current Outpatient Medications  Medication Sig Dispense Refill  . ADVAIR DISKUS 100-50 MCG/DOSE AEPB Inhale 1 puff into the lungs Twice daily.     Marland Kitchen atorvastatin (LIPITOR) 20 MG tablet Take 20 mg by mouth daily.     . flecainide (TAMBOCOR) 100 MG tablet Take 1 tablet (100 mg total) by mouth 2 (two) times daily. 180 tablet 3  . FLUoxetine (PROZAC) 20 MG capsule Take 20 mg by mouth daily.   1  . ibuprofen (ADVIL) 200 MG tablet Take 400 mg by mouth every 6 (six) hours as needed for headache or moderate pain.    . metoprolol tartrate (LOPRESSOR) 50 MG tablet Take 1 tablet (50 mg total) by mouth 2 (two) times daily. 180 tablet 3  . PROAIR HFA 108 (90 BASE) MCG/ACT inhaler Inhale 1-2 puffs into the lungs as needed for wheezing or shortness of breath (Use as directed).      No current facility-administered medications for this visit.     Past Medical History:  Diagnosis Date  . SVT (supraventricular tachycardia) (HCC)    EPS by Dr Ladona Ridgel 2007 demonstrated AP near the AV node for which ablation was deferred    ROS:   All systems reviewed and negative except as noted in the HPI.   Past Surgical History:  Procedure Laterality Date  . ELECTROPHYSIOLOGIC STUDY  2007   EP study by Dr Ladona Ridgel revealed an apparent unusual accessory pathway for which ablation was deferred due to close proximity to the AV node  . ICD IMPLANT N/A 12/07/2018   Procedure: ICD IMPLANT;  Surgeon: Marinus Maw, MD;  Location: [facility] INVASIVE CV LAB;  Service: Cardiovascular;  Laterality: N/A;  .  LEG SURGERY     broken leg  . STOMACH SURGERY    . SVT ABLATION N/A 12/06/2018   Procedure: SVT ABLATION;  Surgeon: Marinus Maw, MD;  Location: [facility] INVASIVE CV LAB;  Service: Cardiovascular;  Laterality: N/A;     Family History  Problem Relation Age of Onset  . Heart disease Maternal Grandmother      Social History   Socioeconomic History  . Marital status: Married    Spouse name: Not on file  . Number of children: 3  . Years of education: Not on file  . Highest education level: Not on file  Occupational History  . 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 Smoking status: Never Smoker  . Smokeless tobacco: Never Used  Substance and Sexual Activity  . Alcohol use: No  . Drug use: No  . Sexual activity: Not on file  Other Topics Concern  . Not on file  Social History Narrative  . Not on file   Social Determinants of Health   Financial Resource Strain:   . Difficulty of Paying Living Expenses:   Food Insecurity:   . Worried About Programme researcher, broadcasting/film/video in the Last Year:   . Barista in the Last Year:   Transportation Needs:   . Lack  of Transportation (Medical):   Marland Kitchen Lack of Transportation (Non-Medical):   Physical Activity:   . Days of Exercise per Week:   . Minutes of Exercise per Session:   Stress:   . Feeling of Stress :   Social Connections:   . Frequency of Communication with Friends and Family:   . Frequency of Social Gatherings with Friends and Family:   . Attends Religious Services:   . Active Member of Clubs or Organizations:   . Attends Archivist Meetings:   Marland Kitchen Marital Status:   Intimate Partner Violence:   . Fear of Current or Ex-Partner:   . Emotionally Abused:   Marland Kitchen Physically Abused:   . Sexually Abused:      BP 122/64   Pulse (!) 54   Ht 5\' 8"  (1.727 m)   Wt 174 lb 12.8 oz (79.3 kg)   SpO2 99%   BMI 26.58 kg/m   Physical Exam:  Well appearing NAD HEENT: Unremarkable Neck:  No JVD, no thyromegally Lymphatics:   No adenopathy Back:  No CVA tenderness Lungs:  Clear with no wheezes HEART:  Regular rate rhythm, no murmurs, no rubs, no clicks Abd:  soft, positive bowel sounds, no organomegally, no rebound, no guarding Ext:  2 plus pulses, no edema, no cyanosis, no clubbing Skin:  No rashes no nodules Neuro:  CN II through XII intact, motor grossly intact  EKG - nsr with first degree AV block  DEVICE  Normal device function.  See PaceArt for details.   Assess/Plan: 1. VT - he remains on flecainide and appears to be well controlled. He will continue his beta blocker 2. ICD - his [employer] Jude DDD ICD is working normally.  3. Possible sarcoid - we do not have a tissue diagnosis but his conduction system disease and gadolinium uptake in the base of the septum would suggest this diagnosis.  Salome Spotted.

## 2019-10-11 NOTE — Patient Instructions (Signed)
Medication Instructions:  Your physician recommends that you continue on your current medications as directed. Please refer to the Current Medication list given to you today.  Labwork: None ordered.  Testing/Procedures: None ordered.  Follow-Up: Your physician wants you to follow-up in: one year with Dr. Ladona Ridgel.   You will receive a reminder letter in the mail two months in advance. If you don't receive a letter, please call our office to schedule the follow-up appointment.  Remote monitoring is used to monitor your ICD from home. This monitoring reduces the number of office visits required to check your device to one time per year. It allows [place] to keep an eye on the functioning of your device to ensure it is working properly. You are scheduled for a device check from home on 12/15/2019. You may send your transmission at any time that day. If you have a wireless device, the transmission will be sent automatically. After your physician reviews your transmission, you will receive a postcard with your next transmission date.  Any Other Special Instructions Will Be Listed Below (If Applicable).  If you need a refill on your cardiac medications before your next appointment, please call your pharmacy.

## 2019-11-09 LAB — CUP PACEART INCLINIC DEVICE CHECK
Battery Remaining Longevity: 96 mo
Brady Statistic RA Percent Paced: 0.31 %
Brady Statistic RV Percent Paced: 0.01 %
Date Time Interrogation Session: 20210608094451
HighPow Impedance: 63 Ohm
Implantable Lead Implant Date: 20200804
Implantable Lead Implant Date: 20200804
Implantable Lead Location: 753859
Implantable Lead Location: 753860
Implantable Lead Model: 7122
Implantable Pulse Generator Implant Date: 20200804
Lead Channel Impedance Value: 537.5 Ohm
Lead Channel Impedance Value: 562.5 Ohm
Lead Channel Pacing Threshold Amplitude: 0.5 V
Lead Channel Pacing Threshold Amplitude: 0.5 V
Lead Channel Pacing Threshold Amplitude: 0.5 V
Lead Channel Pacing Threshold Amplitude: 0.5 V
Lead Channel Pacing Threshold Pulse Width: 0.5 ms
Lead Channel Pacing Threshold Pulse Width: 0.5 ms
Lead Channel Pacing Threshold Pulse Width: 0.5 ms
Lead Channel Pacing Threshold Pulse Width: 0.5 ms
Lead Channel Sensing Intrinsic Amplitude: 11.5 mV
Lead Channel Sensing Intrinsic Amplitude: 5 mV
Lead Channel Setting Pacing Amplitude: 2 V
Lead Channel Setting Pacing Amplitude: 2.5 V
Lead Channel Setting Pacing Pulse Width: 0.5 ms
Lead Channel Setting Sensing Sensitivity: 0.5 mV
Pulse Gen Serial Number: 9903314

## 2019-12-15 ENCOUNTER — Ambulatory Visit (INDEPENDENT_AMBULATORY_CARE_PROVIDER_SITE_OTHER): Payer: BC Managed Care – PPO | Admitting: *Deleted

## 2019-12-15 DIAGNOSIS — I472 Ventricular tachycardia, unspecified: Secondary | ICD-10-CM

## 2019-12-15 LAB — CUP PACEART REMOTE DEVICE CHECK
Battery Remaining Longevity: 90 mo
Battery Remaining Percentage: 88 %
Battery Voltage: 3.08 V
Brady Statistic AP VP Percent: 1 %
Brady Statistic AP VS Percent: 1 %
Brady Statistic AS VP Percent: 1 %
Brady Statistic AS VS Percent: 99 %
Brady Statistic RA Percent Paced: 1 %
Brady Statistic RV Percent Paced: 1 %
Date Time Interrogation Session: 20210812040019
HighPow Impedance: 69 Ohm
HighPow Impedance: 69 Ohm
Implantable Lead Implant Date: 20200804
Implantable Lead Implant Date: 20200804
Implantable Lead Location: 753859
Implantable Lead Location: 753860
Implantable Lead Model: 7122
Implantable Pulse Generator Implant Date: 20200804
Lead Channel Impedance Value: 540 Ohm
Lead Channel Impedance Value: 580 Ohm
Lead Channel Pacing Threshold Amplitude: 0.5 V
Lead Channel Pacing Threshold Amplitude: 0.5 V
Lead Channel Pacing Threshold Pulse Width: 0.5 ms
Lead Channel Pacing Threshold Pulse Width: 0.5 ms
Lead Channel Sensing Intrinsic Amplitude: 11.5 mV
Lead Channel Sensing Intrinsic Amplitude: 5 mV
Lead Channel Setting Pacing Amplitude: 2 V
Lead Channel Setting Pacing Amplitude: 2.5 V
Lead Channel Setting Pacing Pulse Width: 0.5 ms
Lead Channel Setting Sensing Sensitivity: 0.5 mV
Pulse Gen Serial Number: 9903314

## 2019-12-19 NOTE — Progress Notes (Signed)
Remote ICD transmission.   

## 2020-02-02 ENCOUNTER — Telehealth: Payer: Self-pay

## 2020-02-02 NOTE — Telephone Encounter (Signed)
Merlin alert received 02/02/20 for 1 episode of VT that was successfully converted after 2 burst of ATP.   Saw Dr. Ladona Ridgel on 10/11/19 - Continue flecainide 100 mg BID and Lopressor 50 mg BID.   Patient reports of palpitations and shortness of breath during episode. States he sat in chair and felt improvement.   Shock plan reviewed. [place] DMV driving restrictions reviewed.  Advised patient I am forwarding this to Dr. Ladona Ridgel and we will call if he recommends any changes. Verbalized understanding.

## 2020-02-03 NOTE — Telephone Encounter (Signed)
Message reviewed by Dr. Ladona Ridgel.  No change at this time.  Continue current medications

## 2020-03-15 ENCOUNTER — Ambulatory Visit (INDEPENDENT_AMBULATORY_CARE_PROVIDER_SITE_OTHER): Payer: BC Managed Care – PPO

## 2020-03-15 DIAGNOSIS — I472 Ventricular tachycardia, unspecified: Secondary | ICD-10-CM

## 2020-03-15 LAB — CUP PACEART REMOTE DEVICE CHECK
Battery Remaining Longevity: 88 mo
Battery Remaining Percentage: 87 %
Battery Voltage: 3.04 V
Brady Statistic AP VP Percent: 1 %
Brady Statistic AP VS Percent: 1 %
Brady Statistic AS VP Percent: 1 %
Brady Statistic AS VS Percent: 99 %
Brady Statistic RA Percent Paced: 1 %
Brady Statistic RV Percent Paced: 1 %
Date Time Interrogation Session: 20211111030019
HighPow Impedance: 71 Ohm
HighPow Impedance: 71 Ohm
Implantable Lead Implant Date: 20200804
Implantable Lead Implant Date: 20200804
Implantable Lead Location: 753859
Implantable Lead Location: 753860
Implantable Lead Model: 7122
Implantable Pulse Generator Implant Date: 20200804
Lead Channel Impedance Value: 530 Ohm
Lead Channel Impedance Value: 540 Ohm
Lead Channel Pacing Threshold Amplitude: 0.5 V
Lead Channel Pacing Threshold Amplitude: 0.5 V
Lead Channel Pacing Threshold Pulse Width: 0.5 ms
Lead Channel Pacing Threshold Pulse Width: 0.5 ms
Lead Channel Sensing Intrinsic Amplitude: 11.9 mV
Lead Channel Sensing Intrinsic Amplitude: 5 mV
Lead Channel Setting Pacing Amplitude: 2 V
Lead Channel Setting Pacing Amplitude: 2.5 V
Lead Channel Setting Pacing Pulse Width: 0.5 ms
Lead Channel Setting Sensing Sensitivity: 0.5 mV
Pulse Gen Serial Number: 9903314

## 2020-03-19 NOTE — Progress Notes (Signed)
Remote ICD transmission.   

## 2020-06-01 ENCOUNTER — Other Ambulatory Visit: Payer: Self-pay | Admitting: Internal Medicine

## 2020-06-14 ENCOUNTER — Ambulatory Visit (INDEPENDENT_AMBULATORY_CARE_PROVIDER_SITE_OTHER): Payer: BC Managed Care – PPO

## 2020-06-14 DIAGNOSIS — I472 Ventricular tachycardia, unspecified: Secondary | ICD-10-CM

## 2020-06-15 LAB — CUP PACEART REMOTE DEVICE CHECK
Battery Remaining Longevity: 86 mo
Battery Remaining Percentage: 84 %
Battery Voltage: 3.02 V
Brady Statistic AP VP Percent: 1 %
Brady Statistic AP VS Percent: 1 %
Brady Statistic AS VP Percent: 1 %
Brady Statistic AS VS Percent: 99 %
Brady Statistic RA Percent Paced: 1 %
Brady Statistic RV Percent Paced: 1 %
Date Time Interrogation Session: 20220210234520
HighPow Impedance: 68 Ohm
HighPow Impedance: 68 Ohm
Implantable Lead Implant Date: 20200804
Implantable Lead Implant Date: 20200804
Implantable Lead Location: 753859
Implantable Lead Location: 753860
Implantable Lead Model: 7122
Implantable Pulse Generator Implant Date: 20200804
Lead Channel Impedance Value: 540 Ohm
Lead Channel Impedance Value: 600 Ohm
Lead Channel Pacing Threshold Amplitude: 0.5 V
Lead Channel Pacing Threshold Amplitude: 0.5 V
Lead Channel Pacing Threshold Pulse Width: 0.5 ms
Lead Channel Pacing Threshold Pulse Width: 0.5 ms
Lead Channel Sensing Intrinsic Amplitude: 11.5 mV
Lead Channel Sensing Intrinsic Amplitude: 5 mV
Lead Channel Setting Pacing Amplitude: 2 V
Lead Channel Setting Pacing Amplitude: 2.5 V
Lead Channel Setting Pacing Pulse Width: 0.5 ms
Lead Channel Setting Sensing Sensitivity: 0.5 mV
Pulse Gen Serial Number: 9903314

## 2020-06-20 NOTE — Progress Notes (Signed)
Remote ICD transmission.   

## 2020-08-05 IMAGING — MR MR CARDIA MORPHOLOGY WITHOUT AND WITH CONTRAST
17 of 19 series · 38 of 40 positions shown · IV contrast (gadavist)
Comparison: ECHO 12/04/2018

EXAM:
CARDIAC MRI

CLINICAL DATA: Conduction disorder

Ventricular tachycardia
TECHNIQUE: The patient was scanned on a 1.5 Tesla GE magnet. A dedicated
cardiac coil was used. Functional imaging was done using Fiesta
sequences. [DATE], and 4 chamber views were done to assess for RWMA's.
Modified Elifnaz rule using a short axis stack was used to
calculate an ejection fraction on a dedicated work station using
Circle software. The patient received 10 cc of Gadavist. After 10
minutes inversion recovery sequences were used to assess for
infiltration and scar tissue.
CONTRAST:  10 cc  of Gadavist

[Series 6: bSSFP · oblique · 8.0mm · 1.61mm/px · 13 of 450 slices shown (1 of 9)]
[im 1/450]
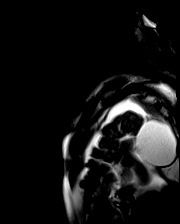
[im 38/450]
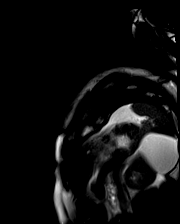
[im 75/450]
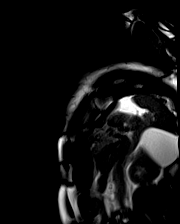
[im 113/450]
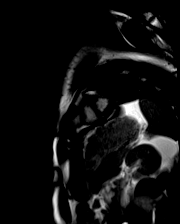
[im 150/450]
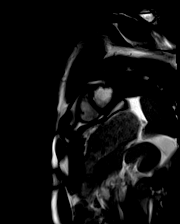
[im 188/450]
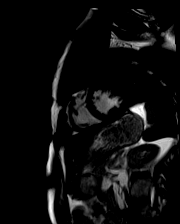
[im 225/450]
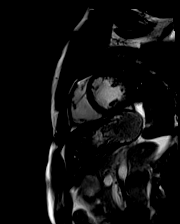
[im 262/450]
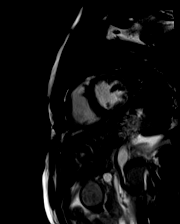
[im 300/450]
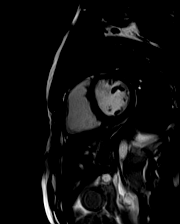
[im 337/450]
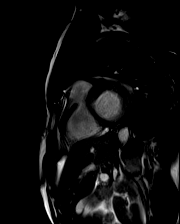
[im 375/450]
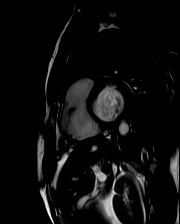
[im 412/450]
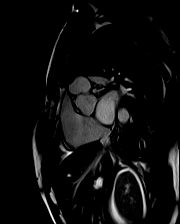
[im 450/450]
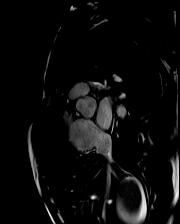

[Series 7: 3ir str axial · axial · 6.0mm · 1.35mm/px · 1 of 15 slices shown]
[im 1/15]
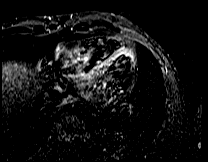

[Series 8: bSSFP · axial · 6.0mm · 1.61mm/px · z∈[-82,+2]mm · 10 of 375 slices shown (2 of 9)]
[im 1/375]
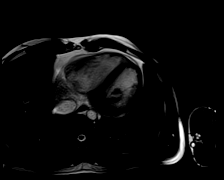
[im 42/375]
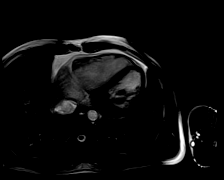
[im 84/375]
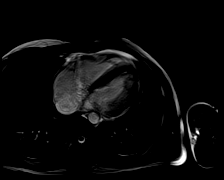
[im 125/375]
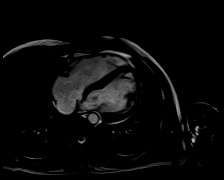
[im 167/375]
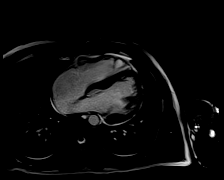
[im 208/375]
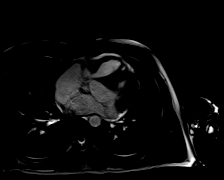
[im 250/375]
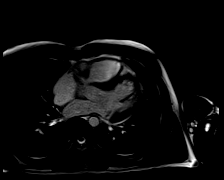
[im 291/375]
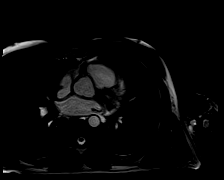
[im 333/375]
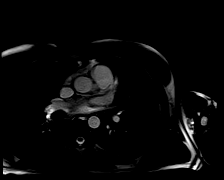
[im 375/375]
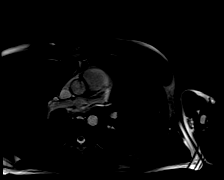

[Series 9: t1_tse_db sag · sagittal · 5.0mm · 1.03mm/px · 1 of 18 slices shown]
[im 1/18]
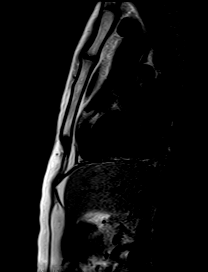

[Series 10: bSSFP · coronal · 6.0mm · 1.25mm/px · 1 of 25 slices shown (3 of 9)]
[im 1/25]
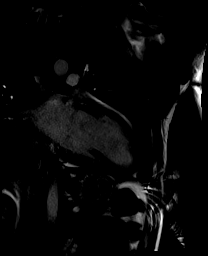

[Series 11: bSSFP · oblique · 6.0mm · 1.25mm/px · 1 of 25 slices shown (4 of 9)]
[im 1/25]
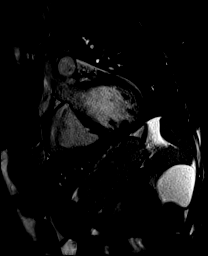

[Series 12: bSSFP · sagittal · 6.0mm · 1.25mm/px · 1 of 25 slices shown (5 of 9)]
[im 1/25]
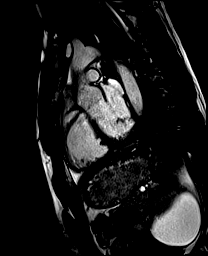

[Series 13: bSSFP · axial · 6.0mm · 1.25mm/px · 1 of 25 slices shown (6 of 9)]
[im 1/25]
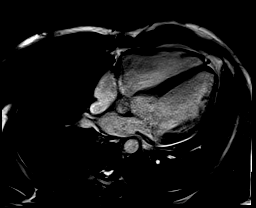

[Series 14: bSSFP · oblique · 6.0mm · 1.25mm/px · 1 of 25 slices shown (7 of 9)]
[im 1/25]
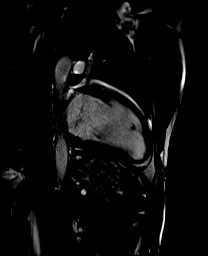

[Series 15: bSSFP · sagittal · 6.0mm · 1.25mm/px · 1 of 25 slices shown (8 of 9)]
[im 1/25]
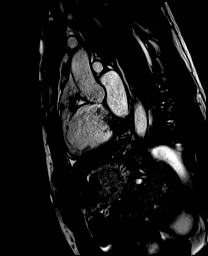

[Series 16: bSSFP · oblique · 6.0mm · 1.25mm/px · 1 of 25 slices shown (9 of 9)]
[im 1/25]
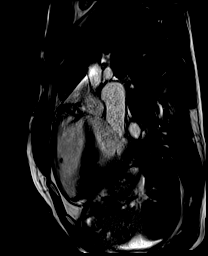

[Series 18: lge_single shot sa · oblique · 8.0mm · 1.98mm/px · 1 of 17 slices shown (1 of 2)]
[im 1/17]
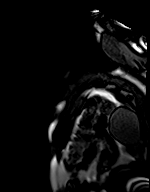

[Series 19: lge_single shot sa · oblique · 8.0mm · 1.98mm/px · 1 of 17 slices shown (2 of 2)]
[im 1/17]
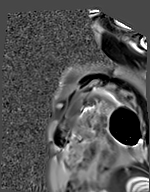

[Series 20: lge_single shot radial_mag · axial · 6.0mm · 1.98mm/px · 1 of 1 slices shown]
[im 1/1]
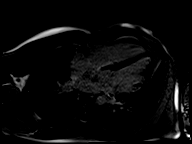

[Series 21: lge_single shot radial_psir · axial · 6.0mm · 1.98mm/px · 1 of 1 slices shown]
[im 1/1]
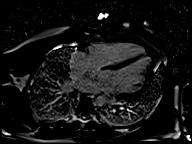

[Series 27: lge short axis_mag · oblique · 6.0mm · 1.52mm/px · 1 of 22 slices shown]
[im 1/22]
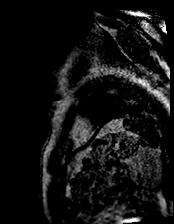

[Series 28: lge short axis_psir · oblique · 6.0mm · 1.52mm/px · 1 of 22 slices shown]
[im 1/22]
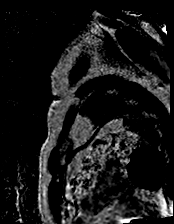

[38 of 40 positions shown; findings below may reference images not displayed]

FINDINGS: LEFT VENTRICLE:

Normal left ventricular size and systolic function (LVEF =57%).

There is hypokinesis of the basal LV septum with wall thinning. In
this region, there is a small amount of mid myocardial delayed
enhancement (series 15 image 5), though this interpretation is
impacted by respiratory motion artifact and wall thinning. No
definite myocardial edema. There is also subendocardial delayed
enhancement on the RV side near the superior RV insertion point
(series 16 image 5).

These findings are suggestive of a granulomatous process such as
cardiac sarcoidosis.

Normal T1 myocardial nulling kinetics, suggesting against a
diagnosis of cardiac amyloidosis.

RIGHT VENTRICLE:

Normal right ventricular size, thickness and systolic function, EF
quantitation may be impacted by motion artifact, visually appears
preserved. There are no regional wall motion abnormalities. No focal
aneurysms, free wall outpouching, or focal dyskinetic segments - no
evidence of arrhythmogenic cardiomyopathy involving the right
ventricle. Delayed enhancement as noted above.

ATRIA:

Normal left and right atrial size.

VALVES:

Mitral annular disjunction is present with bileaflet mitral valve
prolapse and likely mild mitral valve regurgitation. No other
significant valvular abnormalities noted.

PERICARDIUM:

Normal pericardium.  No pericardial effusion.

OTHER:

MEASUREMENTS:

LVESV: 90.61 ml

LVSV: 118.68 ml

LVEF: 56.71 %

LVCO: 7136.74 ml/min

LVCI: 3.74 l/min/m

RVESV: 126.53 ml

RVSV: 88.30 ml
IMPRESSION: 1. Basal left ventricular septal thinning with mild hypokinesis, and
small amount of primarily mid myocardial delayed gadolinium
enhancement. These findings suggest possible granulomatous process
such as cardiac sarcoidosis.

2. Mitral annular disjunction with bileaflet mitral valve prolapse
and mild mitral valve regurgitation.

3.  No findings to suggest cardiac amyloidosis or ARVC.

## 2020-09-08 ENCOUNTER — Other Ambulatory Visit: Payer: Self-pay | Admitting: Internal Medicine

## 2020-09-13 ENCOUNTER — Telehealth: Payer: Self-pay

## 2020-09-13 ENCOUNTER — Ambulatory Visit (INDEPENDENT_AMBULATORY_CARE_PROVIDER_SITE_OTHER): Payer: BC Managed Care – PPO

## 2020-09-13 DIAGNOSIS — I472 Ventricular tachycardia, unspecified: Secondary | ICD-10-CM

## 2020-09-13 LAB — CUP PACEART REMOTE DEVICE CHECK
Battery Remaining Longevity: 85 mo
Battery Remaining Percentage: 83 %
Battery Voltage: 3.01 V
Brady Statistic AP VP Percent: 1 %
Brady Statistic AP VS Percent: 1 %
Brady Statistic AS VP Percent: 1 %
Brady Statistic AS VS Percent: 99 %
Brady Statistic RA Percent Paced: 1 %
Brady Statistic RV Percent Paced: 1 %
Date Time Interrogation Session: 20220512040018
HighPow Impedance: 68 Ohm
HighPow Impedance: 68 Ohm
Implantable Lead Implant Date: 20200804
Implantable Lead Implant Date: 20200804
Implantable Lead Location: 753859
Implantable Lead Location: 753860
Implantable Lead Model: 7122
Implantable Pulse Generator Implant Date: 20200804
Lead Channel Impedance Value: 530 Ohm
Lead Channel Impedance Value: 600 Ohm
Lead Channel Pacing Threshold Amplitude: 0.5 V
Lead Channel Pacing Threshold Amplitude: 0.5 V
Lead Channel Pacing Threshold Pulse Width: 0.5 ms
Lead Channel Pacing Threshold Pulse Width: 0.5 ms
Lead Channel Sensing Intrinsic Amplitude: 11.6 mV
Lead Channel Sensing Intrinsic Amplitude: 5 mV
Lead Channel Setting Pacing Amplitude: 2 V
Lead Channel Setting Pacing Amplitude: 2.5 V
Lead Channel Setting Pacing Pulse Width: 0.5 ms
Lead Channel Setting Sensing Sensitivity: 0.5 mV
Pulse Gen Serial Number: 9903314

## 2020-09-13 NOTE — Telephone Encounter (Signed)
His tachycardias are well known. GT

## 2020-09-13 NOTE — Telephone Encounter (Signed)
.   1 NSVT event appear true VT with onset below detection and due to this unclear of true onset/duration before event accelerates into detection zone become and becomes a wide complex morphology.   Patient called and reports he was at work during this time but was asymptomatic. Patient compliance with medications including flecainide 100 mg BID and Lopressor 50 mg BID. Patient does have recall in and has a new address. Advised patient I will forward to scheduling to see about making an apt. With Dr. Ladona Ridgel. Patient agreeable to plan. Advised patient if Dr. Ladona Ridgel has any changes in care we will call patient. Patient appreciative of call.

## 2020-10-05 NOTE — Progress Notes (Signed)
Remote ICD transmission.   

## 2020-10-11 ENCOUNTER — Other Ambulatory Visit: Payer: Self-pay

## 2020-10-11 ENCOUNTER — Emergency Department (HOSPITAL_BASED_OUTPATIENT_CLINIC_OR_DEPARTMENT_OTHER)
Admission: EM | Admit: 2020-10-11 | Discharge: 2020-10-11 | Disposition: A | Discharge status: Home / Self Care | Payer: Worker's Compensation | Attending: Emergency Medicine | Admitting: Emergency Medicine

## 2020-10-11 ENCOUNTER — Encounter (HOSPITAL_BASED_OUTPATIENT_CLINIC_OR_DEPARTMENT_OTHER): Payer: Self-pay | Admitting: *Deleted

## 2020-10-11 DIAGNOSIS — S61012A Laceration without foreign body of left thumb without damage to nail, initial encounter: Secondary | ICD-10-CM | POA: Insufficient documentation

## 2020-10-11 DIAGNOSIS — Z79899 Other long term (current) drug therapy: Secondary | ICD-10-CM | POA: Diagnosis not present

## 2020-10-11 DIAGNOSIS — W25XXXA Contact with sharp glass, initial encounter: Secondary | ICD-10-CM | POA: Insufficient documentation

## 2020-10-11 DIAGNOSIS — S6992XA Unspecified injury of left wrist, hand and finger(s), initial encounter: Secondary | ICD-10-CM | POA: Diagnosis present

## 2020-10-11 DIAGNOSIS — J45909 Unspecified asthma, uncomplicated: Secondary | ICD-10-CM | POA: Diagnosis not present

## 2020-10-11 MED ORDER — LIDOCAINE HCL (PF) 1 % IJ SOLN
5.0000 mL | Freq: Once | INTRAMUSCULAR | Status: AC
  Administered 2020-10-11: 5 mL
  Filled 2020-10-11: qty 5

## 2020-10-11 NOTE — ED Triage Notes (Signed)
W/C left thumb laceration on a broken glass jar. Bleeding controlled.

## 2020-10-11 NOTE — Discharge Instructions (Addendum)
No restrictions on work duty.  Keep the thumb clean and covered.

## 2020-10-11 NOTE — ED Provider Notes (Signed)
[facility] EMERGENCY DEPARTMENT Provider Note   CSN: [id number] Arrival date & time: 10/11/20  2041     History No chief complaint on file.   Xavier [name] is a 40 y.o. male.  The history is provided by the patient.  Laceration Location:  Hand Hand laceration location:  L fingers (thumb) Length:  1.5 cm Depth:  Cutaneous Quality: straight   Bleeding: controlled   Time since incident:  2 hours Laceration mechanism:  Broken glass (Reaching into a box, and he could not see that there was broken glass.) Pain details:    Severity:  Mild   Timing:  Constant   Progression:  Unchanged Foreign body present:  No foreign bodies Relieved by:  Nothing Worsened by:  Movement Ineffective treatments:  None tried Tetanus status:  Up to date Associated symptoms: no fever, no focal weakness, no numbness, no rash, no redness, no swelling and no streaking       Past Medical History:  Diagnosis Date   SVT (supraventricular tachycardia) (HCC)    EPS by Dr Ladona Ridgel 2007 demonstrated AP near the AV node for which ablation was deferred    Patient Active Problem List   Diagnosis Date Noted   Infiltrative cardiomyopathy (HCC) 10/11/2019   VT (ventricular tachycardia) (HCC) 06/06/2019   ICD (implantable cardioverter-defibrillator) in place 01/28/2019   Hyperlipidemia 11/23/2008   SVT (supraventricular tachycardia) (HCC) 11/23/2008   Asthma 11/23/2008   PREMATURE BIRTH 11/23/2008   SYNCOPE 11/23/2008    Past Surgical History:  Procedure Laterality Date   ELECTROPHYSIOLOGIC STUDY  2007   EP study by Dr Ladona Ridgel revealed an apparent unusual accessory pathway for which ablation was deferred due to close proximity to the AV node   ICD IMPLANT N/A 12/07/2018   Procedure: ICD IMPLANT;  Surgeon: Marinus Maw, MD;  Location: [facility] INVASIVE CV LAB;  Service: Cardiovascular;  Laterality: N/A;   LEG SURGERY     broken leg   STOMACH SURGERY     SVT ABLATION N/A 12/06/2018   Procedure: SVT  ABLATION;  Surgeon: Marinus Maw, MD;  Location: [facility] INVASIVE CV LAB;  Service: Cardiovascular;  Laterality: N/A;       Family History  Problem Relation Age of Onset   Heart disease Maternal Grandmother     Social History   Tobacco Use   Smoking status: Never   Smokeless tobacco: Never  Vaping Use   Vaping Use: Never used  Substance Use Topics   Alcohol use: No   Drug use: No    Home Medications Prior to Admission medications   Medication Sig Start Date End Date Taking? Authorizing Provider  ADVAIR DISKUS 100-50 MCG/DOSE AEPB Inhale 1 puff into the lungs Twice daily.  01/06/12  Yes [provider]  flecainide (TAMBOCOR) 100 MG tablet Take 1 tablet (100 mg total) by mouth 2 (two) times daily. Please make yearly appt with Dr. Ladona Ridgel for June 2022 for future refills. Thank you 1st attempt 09/10/20  Yes Marinus Maw, MD  FLUoxetine (PROZAC) 20 MG capsule Take 20 mg by mouth daily.  02/25/18  Yes [provider]  ibuprofen (ADVIL) 200 MG tablet Take 400 mg by mouth every 6 (six) hours as needed for headache or moderate pain.   Yes [provider]  metoprolol tartrate (LOPRESSOR) 50 MG tablet TAKE 1 TABLET BY MOUTH TWICE A DAY 06/01/20  Yes Marinus Maw, MD  PROAIR HFA 108 (90 BASE) MCG/ACT inhaler Inhale 1-2 puffs into the lungs as  needed for wheezing or shortness of breath (Use as directed).  01/06/12  Yes [provider]  atorvastatin (LIPITOR) 20 MG tablet Take 20 mg by mouth daily.  02/26/18   [provider]    Allergies    Patient has no known allergies.  Review of Systems   Review of Systems  Constitutional:  Negative for chills and fever.  HENT:  Negative for ear pain and sore throat.   Eyes:  Negative for pain and visual disturbance.  Respiratory:  Negative for cough and shortness of breath.   Cardiovascular:  Negative for chest pain and palpitations.  Gastrointestinal:  Negative for abdominal pain and vomiting.   Genitourinary:  Negative for dysuria and hematuria.  Musculoskeletal:  Negative for arthralgias and back pain.  Skin:  Negative for color change and rash.  Neurological:  Negative for focal weakness, seizures and syncope.  All other systems reviewed and are negative.  Physical Exam Updated Vital Signs BP 125/82 (BP Location: Right Arm)   Pulse 66   Temp 98.3 F (36.8 C) (Oral)   Resp 16   Ht 5\' 8"  (1.727 m)   Wt 72.6 kg   SpO2 99%   BMI 24.33 kg/m   Physical Exam Vitals and nursing note reviewed.  Constitutional:      Appearance: Normal appearance.  HENT:     Head: Normocephalic and atraumatic.  Eyes:     Conjunctiva/sclera: Conjunctivae normal.  Pulmonary:     Effort: Pulmonary effort is normal. No respiratory distress.  Musculoskeletal:        General: No deformity. Normal range of motion.     Cervical back: Normal range of motion.  Skin:    General: Skin is warm and dry.     Comments: Is a 1.5 cm, horizontal laceration on the palmar aspect of the left thumb over the IP joint.  Range of motion at the IP joint is normal. Sensation is normal. Capillary refill is normal.  Neurological:     General: No focal deficit present.     Mental Status: He is alert and oriented to person, place, and time. Mental status is at baseline.  Psychiatric:        Mood and Affect: Mood normal.    ED Results / Procedures / Treatments   Labs (all labs ordered are listed, but only abnormal results are displayed) Labs Reviewed - No data to display  EKG None  Radiology No results found.  Procedures . Laceration Repair  Date/Time: 10/11/2020 10:47 PM Performed by: 12/11/2020, MD Authorized by: Koleen Distance, MD   Consent:    Consent obtained:  Verbal   Consent given by:  Patient   Risks, benefits, and alternatives were discussed: yes     Risks discussed:  Infection, need for additional repair, nerve damage, pain, poor cosmetic result, poor wound healing, retained foreign  body, tendon damage and vascular damage   Alternatives discussed:  No treatment, delayed treatment, observation and referral Universal protocol:    Procedure explained and questions answered to patient or proxy's satisfaction: yes     Immediately prior to procedure, a time out was called: yes     Patient identity confirmed:  Verbally with patient Anesthesia:    Anesthesia method:  Local infiltration   Local anesthetic:  Lidocaine 1% w/o epi Laceration details:    Location:  Finger   Finger location:  L thumb   Length (cm):  1.5   Depth (mm):  2 Pre-procedure details:  Preparation:  Patient was prepped and draped in usual sterile fashion Exploration:    Limited defect created (wound extended): no     Hemostasis achieved with:  Direct pressure   Wound exploration: wound explored through full range of motion and entire depth of wound visualized     Wound extent: no fascia violation noted, no foreign bodies/material noted, no muscle damage noted, no nerve damage noted, no tendon damage noted, no underlying fracture noted and no vascular damage noted     Contaminated: no   Treatment:    Area cleansed with:  Povidone-iodine   Amount of cleaning:  Extensive   Irrigation solution:  Sterile saline   Irrigation volume:  20 cc   Irrigation method:  Syringe   Visualized foreign bodies/material removed: no     Debridement:  None   Undermining:  None   Scar revision: no   Skin repair:    Repair method:  Sutures   Suture size:  5-0   Suture material:  Nylon   Suture technique:  Simple interrupted   Number of sutures:  3 Approximation:    Approximation:  Close Repair type:    Repair type:  Simple Post-procedure details:    Dressing:  Non-adherent dressing   Procedure completion:  Tolerated well, no immediate complications   Medications Ordered in ED Medications  lidocaine (PF) (XYLOCAINE) 1 % injection 5 mL (has no administration in time range)    ED Course  I have reviewed the  triage vital signs and the nursing notes.  Pertinent labs & imaging results that were available during my care of the patient were reviewed by me and considered in my medical decision making (see chart for details).    MDM Rules/Calculators/A&P                          Katina Dung presented with a laceration to his thumb.  It was repaired satisfactorily.  He was given instructions on wound care and return precautions. Final Clinical Impression(s) / ED Diagnoses Final diagnoses:  Laceration of left thumb without foreign body without damage to nail, initial encounter    Rx / DC Orders ED Discharge Orders     None        Koleen Distance, MD 10/11/20 2250

## 2020-11-07 ENCOUNTER — Other Ambulatory Visit: Payer: Self-pay | Admitting: Internal Medicine

## 2020-11-08 ENCOUNTER — Ambulatory Visit (INDEPENDENT_AMBULATORY_CARE_PROVIDER_SITE_OTHER): Payer: BC Managed Care – PPO | Admitting: Internal Medicine

## 2020-11-08 ENCOUNTER — Encounter: Payer: Self-pay | Admitting: Internal Medicine

## 2020-11-08 ENCOUNTER — Other Ambulatory Visit: Payer: Self-pay

## 2020-11-08 VITALS — BP 136/90 | HR 58 | Ht 68.0 in | Wt 186.2 lb

## 2020-11-08 DIAGNOSIS — I472 Ventricular tachycardia, unspecified: Secondary | ICD-10-CM

## 2020-11-08 DIAGNOSIS — Z95810 Presence of automatic (implantable) cardiac defibrillator: Secondary | ICD-10-CM | POA: Diagnosis not present

## 2020-11-08 DIAGNOSIS — I428 Other cardiomyopathies: Secondary | ICD-10-CM | POA: Diagnosis not present

## 2020-11-08 NOTE — Patient Instructions (Addendum)
Medication Instructions:  Your physician recommends that you continue on your current medications as directed. Please refer to the Current Medication list given to you today.  Labwork: None ordered.  Testing/Procedures: None ordered.  Follow-Up: Your physician wants you to follow-up in: one year with Lewayne Bunting, MD or one of the following Advanced Practice Providers on your designated Care Team:   Francis Dowse, [place] Casimiro Needle "Mardelle Matte" Lanna Poche, [place]  Remote monitoring is used to monitor your ICD from home. This monitoring reduces the number of office visits required to check your device to one time per year. It allows [place] to keep an eye on the functioning of your device to ensure it is working properly. You are scheduled for a device check from home on 12/13/2020. You may send your transmission at any time that day. If you have a wireless device, the transmission will be sent automatically. After your physician reviews your transmission, you will receive a postcard with your next transmission date.  Any Other Special Instructions Will Be Listed Below (If Applicable).  Consider taking vitamin D 2000 iu daily  If you need a refill on your cardiac medications before your next appointment, please call your pharmacy.

## 2020-11-08 NOTE — Progress Notes (Signed)
HPI [name] returns today for followup. He has a h/o wide and narrow QRS tachy and syncope. He underwent MRI after his EP study and was found to have scar in the base of the septum. He underwent DDD ICD insertion. He has been well with no recurrent ICD shocks. He has had some ATP. His VT is VA dissociated. He has developed progressive conductions system disease. He denies medical non-compliance. No Known Allergies   Current Outpatient Medications  Medication Sig Dispense Refill   ADVAIR DISKUS 100-50 MCG/DOSE AEPB Inhale 1 puff into the lungs Twice daily.      atorvastatin (LIPITOR) 20 MG tablet Take 20 mg by mouth daily.      flecainide (TAMBOCOR) 100 MG tablet Take 1 tablet (100 mg total) by mouth 2 (two) times daily. Please make yearly appt with Dr. Ladona Ridgel for June 2022 for future refills. Thank you 1st attempt 60 tablet 1   FLUoxetine (PROZAC) 20 MG capsule Take 20 mg by mouth daily.   1   ibuprofen (ADVIL) 200 MG tablet Take 400 mg by mouth every 6 (six) hours as needed for headache or moderate pain.     metoprolol tartrate (LOPRESSOR) 50 MG tablet TAKE 1 TABLET BY MOUTH TWICE A DAY 180 tablet 3   PROAIR HFA 108 (90 BASE) MCG/ACT inhaler Inhale 1-2 puffs into the lungs as needed for wheezing or shortness of breath (Use as directed).      No current facility-administered medications for this visit.     Past Medical History:  Diagnosis Date   SVT (supraventricular tachycardia) (HCC)    EPS by Dr Ladona Ridgel 2007 demonstrated AP near the AV node for which ablation was deferred    ROS:   All systems reviewed and negative except as noted in the HPI.   Past Surgical History:  Procedure Laterality Date   ELECTROPHYSIOLOGIC STUDY  2007   EP study by Dr Ladona Ridgel revealed an apparent unusual accessory pathway for which ablation was deferred due to close proximity to the AV node   ICD IMPLANT N/A 12/07/2018   Procedure: ICD IMPLANT;  Surgeon: Marinus Maw, MD;  Location: [facility]  INVASIVE CV LAB;  Service: Cardiovascular;  Laterality: N/A;   LEG SURGERY     broken leg   STOMACH SURGERY     SVT ABLATION N/A 12/06/2018   Procedure: SVT ABLATION;  Surgeon: Marinus Maw, MD;  Location: [facility] INVASIVE CV LAB;  Service: Cardiovascular;  Laterality: N/A;     Family History  Problem Relation Age of Onset   Heart disease Maternal Grandmother      Social History   Socioeconomic History   Marital status: Divorced    Spouse name: Not on file   Number of children: 3   Years of education: Not on file   Highest education level: Not on file  Occupational History   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  Tobacco Use   Smoking status: Never   Smokeless tobacco: Never  Vaping Use   Vaping Use: Never used  Substance and Sexual Activity   Alcohol use: No   Drug use: No   Sexual activity: Not on file  Other Topics Concern   Not on file  Social History Narrative   Not on file   Social Determinants of Health   Financial Resource Strain: Not on file  Food Insecurity: Not on file  Transportation Needs: Not on file  Physical Activity: Not on file  Stress: Not on file  Social Connections: Not on file  Intimate Partner Violence: Not on file     BP 136/90   Pulse (!) 58   Ht 5\' 8"  (1.727 m)   Wt 186 lb 3.2 oz (84.5 kg)   SpO2 98%   BMI 28.31 kg/m   Physical Exam:  Well appearing NAD HEENT: Unremarkable Neck:  No JVD, no thyromegally Lymphatics:  No adenopathy Back:  No CVA tenderness Lungs:  Clear with no wheezes HEART:  Regular rate rhythm, no murmurs, no rubs, no clicks Abd:  soft, positive bowel sounds, no organomegally, no rebound, no guarding Ext:  2 plus pulses, no edema, no cyanosis, no clubbing Skin:  No rashes no nodules Neuro:  CN II through XII intact, motor grossly intact  EKG - nsr with first degree AV block  DEVICE  Normal device function.  See PaceArt for details.   Assess/Plan:  1. VT - he remains on flecainide and appears to be fairly  well controlled. He will continue his beta blocker 2. ICD - his St. Jude DDD ICD is working normally. 3. Possible sarcoid - we do not have a tissue diagnosis but his conduction system disease and gadolinium uptake in the base of the septum would suggest this diagnosis. 4. HTN - his bp is better at home. He will continue his current meds.   .

## 2020-12-13 ENCOUNTER — Ambulatory Visit (INDEPENDENT_AMBULATORY_CARE_PROVIDER_SITE_OTHER): Payer: BC Managed Care – PPO

## 2020-12-13 DIAGNOSIS — I472 Ventricular tachycardia, unspecified: Secondary | ICD-10-CM

## 2020-12-13 LAB — CUP PACEART REMOTE DEVICE CHECK
Battery Remaining Longevity: 82 mo
Battery Remaining Percentage: 80 %
Battery Voltage: 2.99 V
Brady Statistic AP VP Percent: 1 %
Brady Statistic AP VS Percent: 1 %
Brady Statistic AS VP Percent: 1 %
Brady Statistic AS VS Percent: 99 %
Brady Statistic RA Percent Paced: 1 %
Brady Statistic RV Percent Paced: 1 %
Date Time Interrogation Session: 20220811040017
HighPow Impedance: 73 Ohm
HighPow Impedance: 73 Ohm
Implantable Lead Implant Date: 20200804
Implantable Lead Implant Date: 20200804
Implantable Lead Location: 753859
Implantable Lead Location: 753860
Implantable Lead Model: 7122
Implantable Pulse Generator Implant Date: 20200804
Lead Channel Impedance Value: 510 Ohm
Lead Channel Impedance Value: 590 Ohm
Lead Channel Pacing Threshold Amplitude: 0.75 V
Lead Channel Pacing Threshold Amplitude: 1.25 V
Lead Channel Pacing Threshold Pulse Width: 0.5 ms
Lead Channel Pacing Threshold Pulse Width: 0.5 ms
Lead Channel Sensing Intrinsic Amplitude: 11.8 mV
Lead Channel Sensing Intrinsic Amplitude: 5 mV
Lead Channel Setting Pacing Amplitude: 2 V
Lead Channel Setting Pacing Amplitude: 2.5 V
Lead Channel Setting Pacing Pulse Width: 0.5 ms
Lead Channel Setting Sensing Sensitivity: 0.5 mV
Pulse Gen Serial Number: 9903314

## 2021-01-03 NOTE — Progress Notes (Signed)
Remote ICD transmission.   

## 2021-03-14 ENCOUNTER — Ambulatory Visit (INDEPENDENT_AMBULATORY_CARE_PROVIDER_SITE_OTHER): Payer: BC Managed Care – PPO

## 2021-03-14 DIAGNOSIS — I428 Other cardiomyopathies: Secondary | ICD-10-CM | POA: Diagnosis not present

## 2021-03-15 LAB — CUP PACEART REMOTE DEVICE CHECK
Battery Remaining Longevity: 80 mo
Battery Remaining Percentage: 79 %
Battery Voltage: 2.99 V
Brady Statistic AP VP Percent: 1 %
Brady Statistic AP VS Percent: 1.8 %
Brady Statistic AS VP Percent: 1 %
Brady Statistic AS VS Percent: 96 %
Brady Statistic RA Percent Paced: 1 %
Brady Statistic RV Percent Paced: 1 %
Date Time Interrogation Session: 20221111005424
HighPow Impedance: 66 Ohm
HighPow Impedance: 66 Ohm
Implantable Lead Implant Date: 20200804
Implantable Lead Implant Date: 20200804
Implantable Lead Location: 753859
Implantable Lead Location: 753860
Implantable Lead Model: 7122
Implantable Pulse Generator Implant Date: 20200804
Lead Channel Impedance Value: 530 Ohm
Lead Channel Impedance Value: 590 Ohm
Lead Channel Pacing Threshold Amplitude: 0.75 V
Lead Channel Pacing Threshold Amplitude: 1.25 V
Lead Channel Pacing Threshold Pulse Width: 0.5 ms
Lead Channel Pacing Threshold Pulse Width: 0.5 ms
Lead Channel Sensing Intrinsic Amplitude: 11.7 mV
Lead Channel Sensing Intrinsic Amplitude: 5 mV
Lead Channel Setting Pacing Amplitude: 2 V
Lead Channel Setting Pacing Amplitude: 2.5 V
Lead Channel Setting Pacing Pulse Width: 0.5 ms
Lead Channel Setting Sensing Sensitivity: 0.5 mV
Pulse Gen Serial Number: 9903314

## 2021-03-25 NOTE — Progress Notes (Signed)
Remote ICD transmission.   

## 2021-05-27 ENCOUNTER — Telehealth: Payer: Self-pay

## 2021-05-27 NOTE — Telephone Encounter (Signed)
Abbott alert 1 VT-1 event 05/2121 @ 12:22. 181 bpm x 24 sec. ATP x1 successfully converted to AS/VS. 1-NSVT01/21/23 @ 12:21 toggles just below detection then self terminates.   Spoke with patient regarding episodes, patient reports compliance with Flecainide BID and Lopressor BID, patient voiced understanding of DMV driving restrictions x 6 months. Informed patient he would receive a call back if Dr. Ladona Ridgel had any further recommendations. Patient voiced understanding.

## 2021-06-13 ENCOUNTER — Ambulatory Visit (INDEPENDENT_AMBULATORY_CARE_PROVIDER_SITE_OTHER): Payer: BC Managed Care – PPO

## 2021-06-13 DIAGNOSIS — I472 Ventricular tachycardia, unspecified: Secondary | ICD-10-CM

## 2021-06-13 LAB — CUP PACEART REMOTE DEVICE CHECK
Battery Remaining Longevity: 78 mo
Battery Remaining Percentage: 76 %
Battery Voltage: 2.98 V
Brady Statistic AP VP Percent: 1 %
Brady Statistic AP VS Percent: 1.5 %
Brady Statistic AS VP Percent: 1 %
Brady Statistic AS VS Percent: 97 %
Brady Statistic RA Percent Paced: 1 %
Brady Statistic RV Percent Paced: 1 %
Date Time Interrogation Session: 20230209040036
HighPow Impedance: 75 Ohm
HighPow Impedance: 75 Ohm
Implantable Lead Implant Date: 20200804
Implantable Lead Implant Date: 20200804
Implantable Lead Location: 753859
Implantable Lead Location: 753860
Implantable Lead Model: 7122
Implantable Pulse Generator Implant Date: 20200804
Lead Channel Impedance Value: 530 Ohm
Lead Channel Impedance Value: 580 Ohm
Lead Channel Pacing Threshold Amplitude: 0.75 V
Lead Channel Pacing Threshold Amplitude: 1.25 V
Lead Channel Pacing Threshold Pulse Width: 0.5 ms
Lead Channel Pacing Threshold Pulse Width: 0.5 ms
Lead Channel Sensing Intrinsic Amplitude: 11.5 mV
Lead Channel Sensing Intrinsic Amplitude: 5 mV
Lead Channel Setting Pacing Amplitude: 2 V
Lead Channel Setting Pacing Amplitude: 2.5 V
Lead Channel Setting Pacing Pulse Width: 0.5 ms
Lead Channel Setting Sensing Sensitivity: 0.5 mV
Pulse Gen Serial Number: 9903314

## 2021-06-18 NOTE — Progress Notes (Signed)
Remote ICD transmission.   

## 2021-07-20 ENCOUNTER — Encounter: Payer: Self-pay | Admitting: Internal Medicine

## 2021-07-21 ENCOUNTER — Other Ambulatory Visit: Payer: Self-pay | Admitting: Internal Medicine

## 2021-07-22 MED ORDER — METOPROLOL TARTRATE 50 MG PO TABS: 50.0000 mg | ORAL_TABLET | Freq: Two times a day (BID) | ORAL | 1 refills | Status: DC

## 2021-09-12 ENCOUNTER — Ambulatory Visit (INDEPENDENT_AMBULATORY_CARE_PROVIDER_SITE_OTHER): Payer: BC Managed Care – PPO

## 2021-09-12 DIAGNOSIS — I428 Other cardiomyopathies: Secondary | ICD-10-CM

## 2021-09-12 LAB — CUP PACEART REMOTE DEVICE CHECK
Battery Remaining Longevity: 76 mo
Battery Remaining Percentage: 75 %
Battery Voltage: 2.98 V
Brady Statistic AP VP Percent: 1 %
Brady Statistic AP VS Percent: 1.2 %
Brady Statistic AS VP Percent: 1 %
Brady Statistic AS VS Percent: 97 %
Brady Statistic RA Percent Paced: 1 %
Brady Statistic RV Percent Paced: 1 %
Date Time Interrogation Session: 20230511040017
HighPow Impedance: 75 Ohm
HighPow Impedance: 75 Ohm
Implantable Lead Implant Date: 20200804
Implantable Lead Implant Date: 20200804
Implantable Lead Location: 753859
Implantable Lead Location: 753860
Implantable Lead Model: 7122
Implantable Pulse Generator Implant Date: 20200804
Lead Channel Impedance Value: 540 Ohm
Lead Channel Impedance Value: 600 Ohm
Lead Channel Pacing Threshold Amplitude: 0.75 V
Lead Channel Pacing Threshold Amplitude: 1.25 V
Lead Channel Pacing Threshold Pulse Width: 0.5 ms
Lead Channel Pacing Threshold Pulse Width: 0.5 ms
Lead Channel Sensing Intrinsic Amplitude: 11.7 mV
Lead Channel Sensing Intrinsic Amplitude: 4.8 mV
Lead Channel Setting Pacing Amplitude: 2 V
Lead Channel Setting Pacing Amplitude: 2.5 V
Lead Channel Setting Pacing Pulse Width: 0.5 ms
Lead Channel Setting Sensing Sensitivity: 0.5 mV
Pulse Gen Serial Number: 9903314

## 2021-09-19 NOTE — Progress Notes (Signed)
Remote ICD transmission.   

## 2021-10-16 ENCOUNTER — Other Ambulatory Visit: Payer: Self-pay | Admitting: Internal Medicine

## 2021-12-12 ENCOUNTER — Ambulatory Visit (INDEPENDENT_AMBULATORY_CARE_PROVIDER_SITE_OTHER): Payer: BC Managed Care – PPO

## 2021-12-12 DIAGNOSIS — I472 Ventricular tachycardia, unspecified: Secondary | ICD-10-CM | POA: Diagnosis not present

## 2021-12-13 LAB — CUP PACEART REMOTE DEVICE CHECK
Battery Remaining Longevity: 74 mo
Battery Remaining Percentage: 72 %
Battery Voltage: 2.98 V
Brady Statistic AP VP Percent: 1 %
Brady Statistic AP VS Percent: 1.1 %
Brady Statistic AS VP Percent: 1 %
Brady Statistic AS VS Percent: 98 %
Brady Statistic RA Percent Paced: 1 %
Brady Statistic RV Percent Paced: 1 %
Date Time Interrogation Session: 20230810113259
HighPow Impedance: 72 Ohm
HighPow Impedance: 72 Ohm
Implantable Lead Implant Date: 20200804
Implantable Lead Implant Date: 20200804
Implantable Lead Location: 753859
Implantable Lead Location: 753860
Implantable Lead Model: 7122
Implantable Pulse Generator Implant Date: 20200804
Lead Channel Impedance Value: 530 Ohm
Lead Channel Impedance Value: 590 Ohm
Lead Channel Pacing Threshold Amplitude: 0.75 V
Lead Channel Pacing Threshold Amplitude: 1.25 V
Lead Channel Pacing Threshold Pulse Width: 0.5 ms
Lead Channel Pacing Threshold Pulse Width: 0.5 ms
Lead Channel Sensing Intrinsic Amplitude: 11.5 mV
Lead Channel Sensing Intrinsic Amplitude: 5 mV
Lead Channel Setting Pacing Amplitude: 2 V
Lead Channel Setting Pacing Amplitude: 2.5 V
Lead Channel Setting Pacing Pulse Width: 0.5 ms
Lead Channel Setting Sensing Sensitivity: 0.5 mV
Pulse Gen Serial Number: 9903314

## 2022-01-09 NOTE — Progress Notes (Signed)
Remote ICD transmission.   

## 2022-01-23 ENCOUNTER — Other Ambulatory Visit: Payer: Self-pay | Admitting: Internal Medicine

## 2022-02-06 ENCOUNTER — Other Ambulatory Visit: Payer: Self-pay | Admitting: Internal Medicine

## 2022-02-14 ENCOUNTER — Other Ambulatory Visit: Payer: Self-pay | Admitting: Internal Medicine

## 2022-03-06 ENCOUNTER — Telehealth: Payer: Self-pay

## 2022-03-06 ENCOUNTER — Other Ambulatory Visit: Payer: Self-pay | Admitting: Internal Medicine

## 2022-03-06 MED ORDER — METOPROLOL TARTRATE 50 MG PO TABS: 50.0000 mg | ORAL_TABLET | Freq: Two times a day (BID) | ORAL | 0 refills | Status: DC

## 2022-03-06 MED ORDER — FLECAINIDE ACETATE 100 MG PO TABS: 100.0000 mg | ORAL_TABLET | Freq: Two times a day (BID) | ORAL | 0 refills | Status: DC

## 2022-03-06 NOTE — Telephone Encounter (Signed)
His VT is well known to me.

## 2022-03-06 NOTE — Telephone Encounter (Signed)
Outreach made to Pt.  Pt denies any symptoms.  Advised importance of keeping appointment scheduled for tomorrow 03/07/2022.  Advised no driving for 6 months post device therapy.  Pt indicates understanding.  Alert remote reviewed. Normal device function.   There were 3 NSVT arrhythmias detected that were greater than 20 beats, there was one VT episode that was successfully converted with one burst of ATP, sent to triage Next in-clinic is 03/07/2022.

## 2022-03-07 ENCOUNTER — Ambulatory Visit: Payer: BC Managed Care – PPO | Attending: Physician Assistant | Admitting: Physician Assistant

## 2022-03-07 ENCOUNTER — Encounter: Payer: Self-pay | Admitting: Physician Assistant

## 2022-03-07 VITALS — BP 126/78 | HR 41 | Ht 68.0 in | Wt 188.8 lb

## 2022-03-07 DIAGNOSIS — I472 Ventricular tachycardia, unspecified: Secondary | ICD-10-CM

## 2022-03-07 DIAGNOSIS — Z95810 Presence of automatic (implantable) cardiac defibrillator: Secondary | ICD-10-CM

## 2022-03-07 LAB — CUP PACEART INCLINIC DEVICE CHECK
Battery Remaining Longevity: 75 mo
Brady Statistic RA Percent Paced: 0.43 %
Brady Statistic RV Percent Paced: 0.03 %
Date Time Interrogation Session: 20231103173123
HighPow Impedance: 66.375
Implantable Lead Connection Status: 753985
Implantable Lead Connection Status: 753985
Implantable Lead Implant Date: 20200804
Implantable Lead Implant Date: 20200804
Implantable Lead Location: 753859
Implantable Lead Location: 753860
Implantable Lead Model: 7122
Implantable Pulse Generator Implant Date: 20200804
Lead Channel Impedance Value: 537.5 Ohm
Lead Channel Impedance Value: 587.5 Ohm
Lead Channel Pacing Threshold Amplitude: 0.5 V
Lead Channel Pacing Threshold Amplitude: 0.5 V
Lead Channel Pacing Threshold Amplitude: 0.75 V
Lead Channel Pacing Threshold Amplitude: 0.75 V
Lead Channel Pacing Threshold Pulse Width: 0.5 ms
Lead Channel Pacing Threshold Pulse Width: 0.5 ms
Lead Channel Pacing Threshold Pulse Width: 0.5 ms
Lead Channel Pacing Threshold Pulse Width: 0.5 ms
Lead Channel Sensing Intrinsic Amplitude: 5 mV
Lead Channel Sensing Intrinsic Amplitude: 9.3 mV
Lead Channel Setting Pacing Amplitude: 2 V
Lead Channel Setting Pacing Amplitude: 2.5 V
Lead Channel Setting Pacing Pulse Width: 0.5 ms
Lead Channel Setting Sensing Sensitivity: 0.5 mV
Pulse Gen Serial Number: 9903314

## 2022-03-07 LAB — BASIC METABOLIC PANEL
BUN/Creatinine Ratio: 15 (ref 9–20)
BUN: 20 mg/dL (ref 6–24)
CO2: 23 mmol/L (ref 20–29)
Calcium: 9.4 mg/dL (ref 8.7–10.2)
Chloride: 104 mmol/L (ref 96–106)
Creatinine, Ser: 1.36 mg/dL — ABNORMAL HIGH (ref 0.76–1.27)
Glucose: 89 mg/dL (ref 70–99)
Potassium: 4.1 mmol/L (ref 3.5–5.2)
Sodium: 138 mmol/L (ref 134–144)
eGFR: 67 mL/min/{1.73_m2} (ref >59)

## 2022-03-07 NOTE — Patient Instructions (Signed)
Medication Instructions:   Your physician recommends that you continue on your current medications as directed. Please refer to the Current Medication list given to you today.  *If you need a refill on your cardiac medications before your next appointment, please call your pharmacy*   Lab Work:  BMET [facility] [place]   If you have labs (blood work) drawn today and your tests are completely normal, you will receive your results only by: [place] (if you have MyChart) OR A paper copy in the mail If you have any lab test that is abnormal or we need to change your treatment, we will call you to review the results.   Testing/Procedures: NONE ORDERED  TODAY     Follow-Up: At [facility], you and your health needs are our priority.  As part of our continuing mission to provide you with exceptional heart care, we have created designated Provider Care Teams.  These Care Teams include your primary Cardiologist (physician) and Advanced Practice Providers (APPs -  Physician Assistants and Nurse Practitioners) who all work together to provide you with the care you need, when you need it.  We recommend signing up for the patient portal called "MyChart".  Sign up information is provided on this After Visit Summary.  MyChart is used to connect with patients for Virtual Visits (Telemedicine).  Patients are able to view lab/test results, encounter notes, upcoming appointments, etc.  Non-urgent messages can be sent to your provider as well.   To learn more about what you can do with MyChart, go to [email or web address].    Your next appointment:   2 month(s)  The format for your next appointment:   In Person  Provider:   You may see Dr.Taylor  or one of the following Advanced Practice Providers on your designated Care Team:   Tommye Standard, [place] Legrand Como "Jonni Sanger" Chalmers Cater, [place]  Other Instructions  Important Information About Sugar

## 2022-03-07 NOTE — Progress Notes (Addendum)
Cardiology Office Note Date:  03/07/2022  Patient ID:  Xavier [name], DOB 1981/04/24, MRN [id number] PCP:  Lin Landsman, MD  Electrophysiologist: Dr. Lovena Le    Chief Complaint: VT  History of Present Illness: [name] is a 41 y.o. male with history of SVT EPS by Dr Lovena Le 2007 demonstrated AP near the AV node for which ablation was deferred  Developed VT (w/VA dissociation) > MRI after his EP study and was found to have scar in the base of the septum and ICD implanted.  He saw Dr. Lovena Le 11/08/20, doing well, had developed progressive conduction system disease, no recurrent VT, on flecainide and lopressor. Suspect sarcoidosis. No changes were made.  Device clinic alerted yesterday for treated VT episode.  TODAY He feels quite well. 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 No dizzy spells, unusual weakness, fatigue.  No near syncope or syncope. No CP, palpitations or cardiac awareness. No SOB  He was not surprised to hear he had some VT, because "I have that", but did not have any symptoms would have been awake.  He did have a 2 day gap waiting on the pharmacy for refill, buit had some "spare pills" that he took.   Device information ABBOTT dual chamber ICD implanted 12/07/2018 + appropriate therapy AAD flecainide  Past Medical History:  Diagnosis Date   SVT (supraventricular tachycardia)    EPS by Dr Lovena Le 2007 demonstrated AP near the AV node for which ablation was deferred    Past Surgical History:  Procedure Laterality Date   ELECTROPHYSIOLOGIC STUDY  2007   EP study by Dr Lovena Le revealed an apparent unusual accessory pathway for which ablation was deferred due to close proximity to the AV node   ICD IMPLANT N/A 12/07/2018   Procedure: ICD IMPLANT;  Surgeon: Evans Lance, MD;  Location: [place] CV LAB;  Service: Cardiovascular;  Laterality: N/A;    LEG SURGERY     broken leg   STOMACH SURGERY     SVT ABLATION N/A 12/06/2018   Procedure: SVT ABLATION;  Surgeon: Evans Lance, MD;  Location: [place] CV LAB;  Service: Cardiovascular;  Laterality: N/A;    Current Outpatient Medications  Medication Sig Dispense Refill   ADVAIR DISKUS 100-50 MCG/DOSE AEPB Inhale 1 puff into the lungs Twice daily.      atorvastatin (LIPITOR) 20 MG tablet Take 20 mg by mouth daily.      flecainide (TAMBOCOR) 100 MG tablet Take 1 tablet (100 mg total) by mouth 2 (two) times daily. 30 tablet 0   FLUoxetine (PROZAC) 20 MG capsule Take 20 mg by mouth daily.   1   ibuprofen (ADVIL) 200 MG tablet Take 400 mg by mouth every 6 (six) hours as needed for headache or moderate pain.     metoprolol tartrate (LOPRESSOR) 50 MG tablet TAKE 1 TABLET BY MOUTH TWICE A DAY 180 tablet 0   PROAIR HFA 108 (90 BASE) MCG/ACT inhaler Inhale 1-2 puffs into the lungs as needed for wheezing or shortness of breath (Use as directed).      No current facility-administered medications for this visit.    Allergies:   Patient has no known allergies.   Social History:  The patient  reports that he has never smoked. He has never used smokeless tobacco. He reports that he does not drink alcohol and does not use drugs.   Family  History:  The patient's family history includes Heart disease in his maternal grandmother.  ROS:  Please see the history of present illness.    All other systems are reviewed and otherwise negative.   PHYSICAL EXAM:  VS:  BP 126/78   Pulse (!) 41   Ht 5\' 8"  (1.727 m)   Wt 188 lb 12.8 oz (85.6 kg)   SpO2 96%   BMI 28.71 kg/m  BMI: Body mass index is 28.71 kg/m. Well nourished, well developed, in no acute distress HEENT: normocephalic, atraumatic Neck: no JVD, carotid bruits or masses Cardiac:  RRR; bradycardic, no significant murmurs, no rubs, or gallops Lungs:  CTA b/l, no wheezing, rhonchi or rales Abd: soft, nontender MS: no deformity or  atrophy Ext: no edema Skin: warm and dry, no rash Neuro:  No gross deficits appreciated Psych: euthymic mood, full affect   ICD site is stable, no tethering or discomfort  EKG:  Done today and reviewed by myself shows  AP/VS 41bpm, PR 357ms  Device interrogation done today and reviewed by myself:  Battery and lead measurements are good 03/05/22, VT 190's > ATP > slowed the VT 160's-170's None since then Has had a couple NSVT the days prior   12/07/2018: c.MRI IMPRESSION: 1. Basal left ventricular septal thinning with mild hypokinesis, and small amount of primarily mid myocardial delayed gadolinium enhancement. These findings suggest possible granulomatous process such as cardiac sarcoidosis.   2. 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   3.  No findings to suggest cardiac amyloidosis or ARVC.   12/04/2018; TTE  1. The left ventricle has normal systolic function, with an ejection  fraction of 55-60%. The cavity size was normal. There is mild concentric  left ventricular hypertrophy. Diastolic dysfunction, grade indeterminate.  No evidence of left ventricular  regional wall motion abnormalities.   2. Left atrial size was mildly dilated.   3. The mitral valve is grossly normal. Mild thickening of the mitral  valve leaflet.   4. The tricuspid valve is grossly normal.   5. The aortic valve is tricuspid.   6. The aorta is normal in size and structure.   Recent Labs: No results found for requested labs within last 365 days.  No results found for requested labs within last 365 days.   CrCl cannot be calculated (Patient's most recent lab result is older than the maximum 21 days allowed.).   Wt Readings from Last 3 Encounters:  03/07/22 188 lb 12.8 oz (85.6 kg)  11/08/20 186 lb 3.2 oz (84.5 kg)  10/11/20 160 lb (72.6 kg)     Other studies reviewed: Additional studies/records reviewed today include: summarized  above  ASSESSMENT AND PLAN:  VT Asymptomatic ? Missed medicine  Episode reviewed with Dr. Lovena Le this morning, that he had already reviewed. At this time he did not advise any medication or programming changes. We discussed reducing his VT zone rate though concerns that as a young man, may then have inappropriate therapies.  Discussed if his arrhythmia burden increases, that we might consider PET  Discussed with the patient management plan, he is agreeable Advised not to drive, he is familiar with that restriction   2. SB, 1st degree AVbock Interval similar to his last EKG He is AP on his EKG, though while I am with him AS/VS 50's-60's Both AP and VP are <1% He does have good HR histogram Will watch for any escalating VP%     Disposition: F/u  with [place] in a couple months again, sooner if needed    Current medicines are reviewed at length with the patient today.  The patient did not have any concerns regarding medicines.  Venetia Night, PA-C 03/07/2022 12:58 PM     [address] [place] [address] [phone] (office)  [phone] (fax)

## 2022-03-12 ENCOUNTER — Other Ambulatory Visit: Payer: Self-pay | Admitting: *Deleted

## 2022-03-12 DIAGNOSIS — Z79899 Other long term (current) drug therapy: Secondary | ICD-10-CM

## 2022-03-13 ENCOUNTER — Ambulatory Visit (INDEPENDENT_AMBULATORY_CARE_PROVIDER_SITE_OTHER): Payer: BC Managed Care – PPO

## 2022-03-13 ENCOUNTER — Other Ambulatory Visit: Payer: Self-pay | Admitting: Internal Medicine

## 2022-03-13 DIAGNOSIS — I472 Ventricular tachycardia, unspecified: Secondary | ICD-10-CM | POA: Diagnosis not present

## 2022-03-13 LAB — CUP PACEART REMOTE DEVICE CHECK
Battery Remaining Longevity: 72 mo
Battery Remaining Percentage: 70 %
Battery Voltage: 2.96 V
Brady Statistic AP VP Percent: 1 %
Brady Statistic AP VS Percent: 1 %
Brady Statistic AS VP Percent: 1 %
Brady Statistic AS VS Percent: 98 %
Brady Statistic RA Percent Paced: 1 %
Brady Statistic RV Percent Paced: 1 %
Date Time Interrogation Session: 20231109050602
HighPow Impedance: 77 Ohm
HighPow Impedance: 77 Ohm
Implantable Lead Connection Status: 753985
Implantable Lead Connection Status: 753985
Implantable Lead Implant Date: 20200804
Implantable Lead Implant Date: 20200804
Implantable Lead Location: 753859
Implantable Lead Location: 753860
Implantable Lead Model: 7122
Implantable Pulse Generator Implant Date: 20200804
Lead Channel Impedance Value: 530 Ohm
Lead Channel Impedance Value: 580 Ohm
Lead Channel Pacing Threshold Amplitude: 0.5 V
Lead Channel Pacing Threshold Amplitude: 0.75 V
Lead Channel Pacing Threshold Pulse Width: 0.5 ms
Lead Channel Pacing Threshold Pulse Width: 0.5 ms
Lead Channel Sensing Intrinsic Amplitude: 11.7 mV
Lead Channel Sensing Intrinsic Amplitude: 5 mV
Lead Channel Setting Pacing Amplitude: 2 V
Lead Channel Setting Pacing Amplitude: 2.5 V
Lead Channel Setting Pacing Pulse Width: 0.5 ms
Lead Channel Setting Sensing Sensitivity: 0.5 mV
Pulse Gen Serial Number: 9903314

## 2022-03-14 ENCOUNTER — Ambulatory Visit: Payer: BC Managed Care – PPO | Attending: Physician Assistant

## 2022-03-14 DIAGNOSIS — Z79899 Other long term (current) drug therapy: Secondary | ICD-10-CM

## 2022-03-14 LAB — MAGNESIUM: Magnesium: 1.8 mg/dL (ref 1.6–2.3)

## 2022-03-17 ENCOUNTER — Telehealth: Payer: Self-pay

## 2022-03-17 MED ORDER — MAGNESIUM OXIDE 400 MG PO CAPS: 400.0000 mg | ORAL_CAPSULE | Freq: Every day | ORAL | 3 refills | Status: DC

## 2022-03-17 NOTE — Telephone Encounter (Signed)
Graciella Freer, PA-C 03/17/2022  8:20 AM EST     Would please start on Mag ox 400 mg daily given recent VT   The patient has been notified of the result and verbalized understanding.  All questions (if any) were answered. Macie Burows, RN 03/17/2022 11:17 AM  Advised pt Mag Ox is OTC to pick up at pharmacy.

## 2022-03-18 ENCOUNTER — Other Ambulatory Visit: Payer: Self-pay | Admitting: *Deleted

## 2022-03-18 ENCOUNTER — Other Ambulatory Visit: Payer: Self-pay | Admitting: Internal Medicine

## 2022-03-19 MED ORDER — METOPROLOL TARTRATE 50 MG PO TABS: 50.0000 mg | ORAL_TABLET | Freq: Two times a day (BID) | ORAL | 3 refills | Status: DC

## 2022-03-21 NOTE — Progress Notes (Signed)
Remote ICD transmission.   

## 2022-05-13 ENCOUNTER — Encounter: Payer: BC Managed Care – PPO | Admitting: Internal Medicine

## 2022-06-12 ENCOUNTER — Ambulatory Visit (INDEPENDENT_AMBULATORY_CARE_PROVIDER_SITE_OTHER): Payer: BC Managed Care – PPO

## 2022-06-12 DIAGNOSIS — I428 Other cardiomyopathies: Secondary | ICD-10-CM

## 2022-06-12 LAB — CUP PACEART REMOTE DEVICE CHECK
Battery Remaining Longevity: 70 mo
Battery Remaining Percentage: 68 %
Battery Voltage: 2.96 V
Brady Statistic AP VP Percent: 1 %
Brady Statistic AP VS Percent: 2 %
Brady Statistic AS VP Percent: 1 %
Brady Statistic AS VS Percent: 95 %
Brady Statistic RA Percent Paced: 1 %
Brady Statistic RV Percent Paced: 1 %
Date Time Interrogation Session: 20240208073541
HighPow Impedance: 70 Ohm
HighPow Impedance: 70 Ohm
Implantable Lead Connection Status: 753985
Implantable Lead Connection Status: 753985
Implantable Lead Implant Date: 20200804
Implantable Lead Implant Date: 20200804
Implantable Lead Location: 753859
Implantable Lead Location: 753860
Implantable Lead Model: 7122
Implantable Pulse Generator Implant Date: 20200804
Lead Channel Impedance Value: 530 Ohm
Lead Channel Impedance Value: 580 Ohm
Lead Channel Pacing Threshold Amplitude: 0.5 V
Lead Channel Pacing Threshold Amplitude: 0.75 V
Lead Channel Pacing Threshold Pulse Width: 0.5 ms
Lead Channel Pacing Threshold Pulse Width: 0.5 ms
Lead Channel Sensing Intrinsic Amplitude: 11.8 mV
Lead Channel Sensing Intrinsic Amplitude: 5 mV
Lead Channel Setting Pacing Amplitude: 2 V
Lead Channel Setting Pacing Amplitude: 2.5 V
Lead Channel Setting Pacing Pulse Width: 0.5 ms
Lead Channel Setting Sensing Sensitivity: 0.5 mV
Pulse Gen Serial Number: 9903314

## 2022-07-03 NOTE — Progress Notes (Signed)
Remote ICD transmission.   

## 2022-07-08 ENCOUNTER — Encounter: Payer: Self-pay | Admitting: Internal Medicine

## 2022-07-08 ENCOUNTER — Ambulatory Visit: Payer: BC Managed Care – PPO | Attending: Internal Medicine | Admitting: Internal Medicine

## 2022-07-08 VITALS — BP 118/70 | HR 55 | Ht 69.0 in | Wt 187.6 lb

## 2022-07-08 DIAGNOSIS — I10 Essential (primary) hypertension: Secondary | ICD-10-CM

## 2022-07-08 DIAGNOSIS — I472 Ventricular tachycardia, unspecified: Secondary | ICD-10-CM | POA: Diagnosis not present

## 2022-07-08 DIAGNOSIS — Z95810 Presence of automatic (implantable) cardiac defibrillator: Secondary | ICD-10-CM | POA: Diagnosis not present

## 2022-07-08 NOTE — Patient Instructions (Signed)
Medication Instructions:  Your physician recommends that you continue on your current medications as directed. Please refer to the Current Medication list given to you today.  *If you need a refill on your cardiac medications before your next appointment, please call your pharmacy*  Lab Work: None ordered.  If you have labs (blood work) drawn today and your tests are completely normal, you will receive your results only by: [place] (if you have MyChart) OR A paper copy in the mail If you have any lab test that is abnormal or we need to change your treatment, we will call you to review the results.  Testing/Procedures: None ordered.  Follow-Up: At [facility], you and your health needs are our priority.  As part of our continuing mission to provide you with exceptional heart care, we have created designated Provider Care Teams.  These Care Teams include your primary Cardiologist (physician) and Advanced Practice Providers (APPs -  Physician Assistants and Nurse Practitioners) who all work together to provide you with the care you need, when you need it.  We recommend signing up for the patient portal called "MyChart".  Sign up information is provided on this After Visit Summary.  MyChart is used to connect with patients for Virtual Visits (Telemedicine).  Patients are able to view lab/test results, encounter notes, upcoming appointments, etc.  Non-urgent messages can be sent to your provider as well.   To learn more about what you can do with MyChart, go to [email or web address].    Your next appointment:   1 year(s)  The format for your next appointment:   In Person  Provider:   [name], MD{or one of the following Advanced Practice Providers on your designated Care Team:   Tommye Standard, [place] Legrand Como "Jonni Sanger" Chalmers Cater, [place]  Remote monitoring is used to monitor your ICD from home. This monitoring reduces the number of office visits required to check your device to one  time per year. It allows [place] to keep an eye on the functioning of your device to ensure it is working properly. You are scheduled for a device check from home on 09/11/22. You may send your transmission at any time that day. If you have a wireless device, the transmission will be sent automatically. After your physician reviews your transmission, you will receive a postcard with your next transmission date.

## 2022-07-08 NOTE — Progress Notes (Signed)
HPI Xavier [name] returns today for followup. He has a h/o wide and narrow QRS tachy and syncope. He underwent MRI after his EP study and was found to have scar in the base of the septum. He underwent DDD ICD insertion. He has been well with no recurrent ICD shocks. He has had some ATP. His VT is VA dissociated. He has developed progressive conductions system disease. He denies medical non-compliance.  No Known Allergies   Current Outpatient Medications  Medication Sig Dispense Refill   ADVAIR DISKUS 100-50 MCG/DOSE AEPB Inhale 1 puff into the lungs Twice daily.      atorvastatin (LIPITOR) 20 MG tablet Take 20 mg by mouth daily.      flecainide (TAMBOCOR) 100 MG tablet TAKE 1 TABLET BY MOUTH TWICE A DAY 180 tablet 3   FLUoxetine (PROZAC) 20 MG capsule Take 20 mg by mouth daily.   1   ibuprofen (ADVIL) 200 MG tablet Take 400 mg by mouth every 6 (six) hours as needed for headache or moderate pain.     Magnesium Oxide 400 MG CAPS Take 1 capsule (400 mg total) by mouth daily. 90 capsule 3   metoprolol tartrate (LOPRESSOR) 50 MG tablet Take 1 tablet (50 mg total) by mouth 2 (two) times daily. 180 tablet 3   PROAIR HFA 108 (90 BASE) MCG/ACT inhaler Inhale 1-2 puffs into the lungs as needed for wheezing or shortness of breath (Use as directed).      No current facility-administered medications for this visit.     Past Medical History:  Diagnosis Date   SVT (supraventricular tachycardia)    EPS by Dr Lovena Le 2007 demonstrated AP near the AV node for which ablation was deferred    ROS:   All systems reviewed and negative except as noted in the HPI.   Past Surgical History:  Procedure Laterality Date   ELECTROPHYSIOLOGIC STUDY  2007   EP study by Dr Lovena Le revealed an apparent unusual accessory pathway for which ablation was deferred due to close proximity to the AV node   ICD IMPLANT N/A 12/07/2018   Procedure: ICD IMPLANT;  Surgeon: Evans Lance, MD;  Location: [place] CV LAB;   Service: Cardiovascular;  Laterality: N/A;   LEG SURGERY     broken leg   STOMACH SURGERY     SVT ABLATION N/A 12/06/2018   Procedure: SVT ABLATION;  Surgeon: Evans Lance, MD;  Location: [place] CV LAB;  Service: Cardiovascular;  Laterality: N/A;     Family History  Problem Relation Age of Onset   Heart disease Maternal Grandmother      Social History   Socioeconomic History   Marital status: Single    Spouse name: Not on file   Number of children: 3   Years of education: Not on file   Highest education level: Not on file  Occupational History   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 [place]  Tobacco Use   Smoking status: Never   Smokeless tobacco: Never  Vaping Use   Vaping Use: Never used  Substance and Sexual Activity   Alcohol use: No   Drug use: No   Sexual activity: Not on file  Other Topics Concern   Not on file  Social History Narrative   Not on file   Social Determinants of Health   Financial Resource Strain: Not on file  Food Insecurity: Not on file  Transportation Needs: Not on file  Physical Activity: Not on file  Stress: Not  on file  Social Connections: Not on file  Intimate Partner Violence: Not on file     BP 118/70   Pulse (!) 55   Ht '5\' 9"'$  (1.753 m)   Wt 187 lb 9.6 oz (85.1 kg)   SpO2 100%   BMI 27.70 kg/m   Physical Exam:  Well appearing NAD HEENT: Unremarkable Neck:  No JVD, no thyromegally Lymphatics:  No adenopathy Back:  No CVA tenderness Lungs:  Clear HEART:  Regular rate rhythm, no murmurs, no rubs, no clicks Abd:  soft, positive bowel sounds, no organomegally, no rebound, no guarding Ext:  2 plus pulses, no edema, no cyanosis, no clubbing Skin:  No rashes no nodules Neuro:  CN II through XII intact, motor grossly intact  EKG  DEVICE  Normal device function.  See PaceArt for details.   Assess/Plan:  1.VT - he remains on flecainide and appears to be fairly well controlled. He will continue his beta blocker and  flecainide 2. ICD - his St. Jude DDD ICD is working normally. 3. Possible sarcoid - we do not have a tissue diagnosis but his conduction system disease and gadolinium uptake in the base of the septum would suggest this diagnosis. 4. HTN - his bp is better at home. He will continue his current meds.   Xavier Overlie Lorne Winkels,MD

## 2022-09-11 ENCOUNTER — Ambulatory Visit (INDEPENDENT_AMBULATORY_CARE_PROVIDER_SITE_OTHER): Payer: BC Managed Care – PPO

## 2022-09-11 DIAGNOSIS — I472 Ventricular tachycardia, unspecified: Secondary | ICD-10-CM | POA: Diagnosis not present

## 2022-09-11 LAB — CUP PACEART REMOTE DEVICE CHECK
Battery Remaining Longevity: 68 mo
Battery Remaining Percentage: 66 %
Battery Voltage: 2.96 V
Brady Statistic AP VP Percent: 1 %
Brady Statistic AP VS Percent: 3.1 %
Brady Statistic AS VP Percent: 1 %
Brady Statistic AS VS Percent: 93 %
Brady Statistic RA Percent Paced: 1 %
Brady Statistic RV Percent Paced: 1 %
Date Time Interrogation Session: 20240509051234
HighPow Impedance: 72 Ohm
HighPow Impedance: 72 Ohm
Implantable Lead Connection Status: 753985
Implantable Lead Connection Status: 753985
Implantable Lead Implant Date: 20200804
Implantable Lead Implant Date: 20200804
Implantable Lead Location: 753859
Implantable Lead Location: 753860
Implantable Lead Model: 7122
Implantable Pulse Generator Implant Date: 20200804
Lead Channel Impedance Value: 540 Ohm
Lead Channel Impedance Value: 560 Ohm
Lead Channel Pacing Threshold Amplitude: 0.75 V
Lead Channel Pacing Threshold Amplitude: 0.75 V
Lead Channel Pacing Threshold Pulse Width: 0.5 ms
Lead Channel Pacing Threshold Pulse Width: 0.5 ms
Lead Channel Sensing Intrinsic Amplitude: 11.8 mV
Lead Channel Sensing Intrinsic Amplitude: 5 mV
Lead Channel Setting Pacing Amplitude: 2 V
Lead Channel Setting Pacing Amplitude: 2.5 V
Lead Channel Setting Pacing Pulse Width: 0.5 ms
Lead Channel Setting Sensing Sensitivity: 0.5 mV
Pulse Gen Serial Number: 9903314

## 2022-09-30 NOTE — Progress Notes (Signed)
Remote ICD transmission.   

## 2022-11-19 ENCOUNTER — Emergency Department (HOSPITAL_BASED_OUTPATIENT_CLINIC_OR_DEPARTMENT_OTHER)
Admission: EM | Admit: 2022-11-19 | Discharge: 2022-11-20 | Disposition: A | Discharge status: Home / Self Care | Payer: BC Managed Care – PPO | Attending: Emergency Medicine | Admitting: Emergency Medicine

## 2022-11-19 ENCOUNTER — Encounter (HOSPITAL_BASED_OUTPATIENT_CLINIC_OR_DEPARTMENT_OTHER): Payer: Self-pay | Admitting: Emergency Medicine

## 2022-11-19 ENCOUNTER — Other Ambulatory Visit: Payer: Self-pay

## 2022-11-19 ENCOUNTER — Emergency Department (HOSPITAL_BASED_OUTPATIENT_CLINIC_OR_DEPARTMENT_OTHER): Payer: BC Managed Care – PPO

## 2022-11-19 DIAGNOSIS — J45909 Unspecified asthma, uncomplicated: Secondary | ICD-10-CM | POA: Insufficient documentation

## 2022-11-19 DIAGNOSIS — R079 Chest pain, unspecified: Secondary | ICD-10-CM | POA: Diagnosis not present

## 2022-11-19 DIAGNOSIS — U071 COVID-19: Secondary | ICD-10-CM | POA: Diagnosis not present

## 2022-11-19 DIAGNOSIS — I10 Essential (primary) hypertension: Secondary | ICD-10-CM | POA: Diagnosis not present

## 2022-11-19 DIAGNOSIS — R059 Cough, unspecified: Secondary | ICD-10-CM | POA: Diagnosis not present

## 2022-11-19 DIAGNOSIS — Z79899 Other long term (current) drug therapy: Secondary | ICD-10-CM | POA: Diagnosis not present

## 2022-11-19 DIAGNOSIS — R0600 Dyspnea, unspecified: Secondary | ICD-10-CM

## 2022-11-19 DIAGNOSIS — R0602 Shortness of breath: Secondary | ICD-10-CM | POA: Diagnosis not present

## 2022-11-19 LAB — BASIC METABOLIC PANEL
Anion gap: 10 (ref 5–15)
BUN: 21 mg/dL — ABNORMAL HIGH (ref 6–20)
CO2: 22 mmol/L (ref 22–32)
Calcium: 9.1 mg/dL (ref 8.9–10.3)
Chloride: 106 mmol/L (ref 98–111)
Creatinine, Ser: 1.41 mg/dL — ABNORMAL HIGH (ref 0.61–1.24)
GFR, Estimated: >60 mL/min (ref >60)
Glucose, Bld: 97 mg/dL (ref 70–99)
Potassium: 3.9 mmol/L (ref 3.5–5.1)
Sodium: 138 mmol/L (ref 135–145)

## 2022-11-19 MED ORDER — IPRATROPIUM-ALBUTEROL 0.5-2.5 (3) MG/3ML IN SOLN
3.0000 mL | Freq: Once | RESPIRATORY_TRACT | Status: AC
  Administered 2022-11-19: 3 mL via RESPIRATORY_TRACT
  Filled 2022-11-19: qty 3

## 2022-11-19 MED ORDER — SODIUM CHLORIDE 0.9 % IV BOLUS
1000.0000 mL | Freq: Once | INTRAVENOUS | Status: AC
  Administered 2022-11-19: 1000 mL via INTRAVENOUS

## 2022-11-19 NOTE — ED Provider Notes (Signed)
[facility] Provider Note  CSN: [id number] Arrival date & time: 11/19/22 2305  Chief Complaint(s) Shortness of Breath  HPI Xavier [name] is a 42 y.o. male with past medical history as below, significant for SVT, ICD, HLD, asthma, cardiomyopathy, hypertension who presents to the ED with complaint of chest pain, dyspnea.  Onset of symptoms yesterday evening, woke up with chest tightness, sharp sensation midsternal, difficulty breathing, cough productive with clear/white sputum at times.  No fevers.  No nausea or vomiting.  Respiratory symptoms have mildly worsened over the past 24 hours.  Chest pain intermittent, unchanged severity.  He tried his home albuterol which did not provide much relief of his difficulty breathing.  No fevers, chills, nausea, vomiting or recent travel, sick contacts, no change in bowel or bladder function, compliant with home medications.  He is AICD, does not activated in the past 24 hours, last activation was around 6 months ago per the patient.  No palpitations.  Past Medical History Past Medical History:  Diagnosis Date   SVT (supraventricular tachycardia)    EPS by Dr Ladona Ridgel 2007 demonstrated AP near the AV node for which ablation was deferred   Patient Active Problem List   Diagnosis Date Noted   Hypertension 07/08/2022   Infiltrative cardiomyopathy (HCC) 10/11/2019   VT (ventricular tachycardia) (HCC) 06/06/2019   ICD (implantable cardioverter-defibrillator) in place 01/28/2019   Hyperlipidemia 11/23/2008   SVT (supraventricular tachycardia) (HCC) 11/23/2008   Asthma 11/23/2008   PREMATURE BIRTH 11/23/2008   SYNCOPE 11/23/2008   Home Medication(s) Prior to Admission medications   Medication Sig Start Date End Date Taking? Authorizing Provider  albuterol (PROVENTIL) (2.5 MG/3ML) 0.083% nebulizer solution Take 3 mLs (2.5 mg total) by nebulization every 6 (six) hours as needed for wheezing or shortness of  breath. 11/20/22  Yes Tanda Rockers A, DO  albuterol (VENTOLIN HFA) 108 (90 Base) MCG/ACT inhaler Inhale 1-2 puffs into the lungs every 4 (four) hours as needed for wheezing or shortness of breath. 11/20/22  Yes Tanda Rockers A, DO  guaiFENesin-dextromethorphan (ROBITUSSIN DM) 100-10 MG/5ML syrup Take 5 mLs by mouth every 4 (four) hours as needed for cough. 11/20/22  Yes Sloan Leiter, DO  molnupiravir EUA (LAGEVRIO) 200 mg CAPS capsule Take 4 capsules (800 mg total) by mouth 2 (two) times daily for 5 days. 11/20/22 11/25/22 Yes Tanda Rockers A, DO  ADVAIR DISKUS 100-50 MCG/DOSE AEPB Inhale 1 puff into the lungs Twice daily.  01/06/12   [provider]  atorvastatin (LIPITOR) 20 MG tablet Take 20 mg by mouth daily.  02/26/18   [provider]  flecainide (TAMBOCOR) 100 MG tablet TAKE 1 TABLET BY MOUTH TWICE A DAY 03/14/22   Marinus Maw, MD  FLUoxetine (PROZAC) 20 MG capsule Take 20 mg by mouth daily.  02/25/18   [provider]  ibuprofen (ADVIL) 200 MG tablet Take 400 mg by mouth every 6 (six) hours as needed for headache or moderate pain.    [provider]  Magnesium Oxide 400 MG CAPS Take 1 capsule (400 mg total) by mouth daily. 03/17/22   Graciella Freer, PA-C  metoprolol tartrate (LOPRESSOR) 50 MG tablet Take 1 tablet (50 mg total) by mouth 2 (two) times daily. 03/19/22   Marinus Maw, MD  PROAIR HFA 108 (90 BASE) MCG/ACT inhaler Inhale 1-2 puffs into the lungs as needed for wheezing or shortness of breath (Use as directed).  01/06/12   [provider]  Past Surgical History Past Surgical History:  Procedure Laterality Date   ELECTROPHYSIOLOGIC STUDY  2007   EP study by Dr Ladona Ridgel revealed an apparent unusual accessory pathway for which ablation was deferred due to close proximity to the AV node   ICD IMPLANT N/A  12/07/2018   Procedure: ICD IMPLANT;  Surgeon: Marinus Maw, MD;  Location: [facility] INVASIVE CV LAB;  Service: Cardiovascular;  Laterality: N/A;   LEG SURGERY     broken leg   STOMACH SURGERY     SVT ABLATION N/A 12/06/2018   Procedure: SVT ABLATION;  Surgeon: Marinus Maw, MD;  Location: [facility] INVASIVE CV LAB;  Service: Cardiovascular;  Laterality: N/A;   Family History Family History  Problem Relation Age of Onset   Heart disease Maternal Grandmother     Social History Social History   Tobacco Use   Smoking status: Never   Smokeless tobacco: Never  Vaping Use   Vaping status: Never Used  Substance Use Topics   Alcohol use: No   Drug use: No   Allergies Patient has no known allergies.  Review of Systems Review of Systems  Constitutional:  Negative for chills and fever.  HENT:  Negative for congestion.   Respiratory:  Positive for chest tightness and shortness of breath.   Cardiovascular:  Positive for chest pain.  Gastrointestinal:  Negative for abdominal pain, nausea and vomiting.  Genitourinary:  Negative for difficulty urinating.  Skin:  Negative for rash.  Neurological:  Negative for numbness and headaches.  All other systems reviewed and are negative.   Physical Exam Vital Signs  I have reviewed the triage vital signs BP (!) 147/83   Pulse (!) 57   Temp 98.2 F (36.8 C) (Oral)   Resp 17   Wt 74.8 kg   SpO2 98%   BMI 24.37 kg/m  Physical Exam Vitals and nursing note reviewed.  Constitutional:      General: He is not in acute distress.    Appearance: Normal appearance. He is well-developed. He is not ill-appearing.  HENT:     Head: Normocephalic and atraumatic.     Right Ear: External ear normal.     Left Ear: External ear normal.     Mouth/Throat:     Mouth: Mucous membranes are moist.  Eyes:     General: No scleral icterus. Cardiovascular:     Rate and Rhythm: Normal rate and regular rhythm.     Pulses: Normal pulses.     Heart sounds: Normal  heart sounds.  Pulmonary:     Effort: Pulmonary effort is normal. No tachypnea or respiratory distress.     Breath sounds: Wheezing present.     Comments: Trace wheezing  Chest:    Abdominal:     General: Abdomen is flat.     Palpations: Abdomen is soft.     Tenderness: There is no abdominal tenderness.  Musculoskeletal:     Cervical back: No rigidity.     Right lower leg: No edema.     Left lower leg: No edema.  Skin:    General: Skin is warm and dry.     Capillary Refill: Capillary refill takes less than 2 seconds.  Neurological:     Mental Status: He is alert.  Psychiatric:        Mood and Affect: Mood normal.        Behavior: Behavior normal.     ED Results and Treatments Labs (all labs ordered are listed, but only abnormal  results are displayed) Labs Reviewed  RESP PANEL BY RT-PCR (RSV, FLU A&B, COVID)  RVPGX2 - Abnormal; Notable for the following components:      Result Value   SARS Coronavirus 2 by RT PCR POSITIVE (*)    All other components within normal limits  BASIC METABOLIC PANEL - Abnormal; Notable for the following components:   BUN 21 (*)    Creatinine, Ser 1.41 (*)    All other components within normal limits  CBC WITH DIFFERENTIAL/PLATELET - Abnormal; Notable for the following components:   Platelets 149 (*)    All other components within normal limits  BRAIN NATRIURETIC PEPTIDE - Abnormal; Notable for the following components:   B Natriuretic Peptide 167.0 (*)    All other components within normal limits  TROPONIN I (HIGH SENSITIVITY)  TROPONIN I (HIGH SENSITIVITY)                                                                                                                          Radiology DG Chest 2 View  Result Date: 11/19/2022 CLINICAL DATA:  Shortness of breath and cough EXAM: CHEST - 2 VIEW COMPARISON:  Radiographs 12/08/2018 FINDINGS: Low lung volumes accentuate cardiomediastinal silhouette and pulmonary vascularity. Stable heart size.  Left chest wall ICD. No focal consolidation, pleural effusion, or pneumothorax. No displaced rib fractures. IMPRESSION: No active cardiopulmonary disease. Electronically Signed   By: Minerva Fester M.D.   On: 11/19/2022 23:42    Pertinent labs & imaging results that were available during my care of the patient were reviewed by me and considered in my medical decision making (see MDM for details).  Medications Ordered in ED Medications  albuterol (VENTOLIN HFA) 108 (90 Base) MCG/ACT inhaler 1-2 puff (has no administration in time range)  sodium chloride 0.9 % bolus 1,000 mL (0 mLs Intravenous Stopped 11/20/22 0028)  ipratropium-albuterol (DUONEB) 0.5-2.5 (3) MG/3ML nebulizer solution 3 mL (3 mLs Nebulization Given 11/19/22 2340)                                                                                                                                     Procedures Procedures  (including critical care time)  Medical Decision Making / ED Course    Medical Decision Making:    Giovannie Keto is a 42 y.o. male with past medical history as below, significant for SVT, ICD, HLD, asthma, cardiomyopathy, hypertension who  presents to the ED with complaint of chest pain, dyspnea.. The complaint involves an extensive differential diagnosis and also carries with it a high risk of complications and morbidity.  Serious etiology was considered. Ddx includes but is not limited to: Differential includes all life-threatening causes for chest pain. This includes but is not exclusive to acute coronary syndrome, aortic dissection, pulmonary embolism, cardiac tamponade, community-acquired pneumonia, pericarditis, musculoskeletal chest wall pain, etc. In my evaluation of this patient's dyspnea my DDx includes, but is not limited to, pneumonia, pulmonary embolism, pneumothorax, pulmonary edema, metabolic acidosis, asthma, COPD, cardiac cause, anemia, anxiety, etc.    Complete initial physical exam performed,  notably the patient  was no acute distress, sitting upright on stretcher, no hypoxia.    Reviewed and confirmed nursing documentation for past medical history, family history, social history.  Vital signs reviewed.    Clinical Course as of 11/20/22 7829  Wed Nov 19, 2022  2342 Creatinine(!): 1.41 Similar to prior [SG]  Thu Nov 20, 2022  0034 SARS Coronavirus 2 by RT PCR(!): POSITIVE [SG]    Clinical Course User Index [SG] Sloan Leiter, DO    Labs reviewed, COVID-19 positive Labs otherwise stable Chest x-ray stable EKG without acute ischemic changes, troponin negative x 2.  ACS unlikely at this time. Fever symptoms secondary to COVID-19 infection. He is feeling much better after nebulized breathing treatment He needs refills for his albuterol MDI and nebulizer at home Given antitussive for home, molnupiravir No hypoxia, tolerating PO, stable for o/p treatment of condition  Advised to follow-up with PCP for recheck  The patient improved significantly and was discharged in stable condition. Detailed discussions were had with the patient regarding current findings, and need for close f/u with PCP or on call doctor. The patient has been instructed to return immediately if the symptoms worsen in any way for re-evaluation. Patient verbalized understanding and is in agreement with current care plan. All questions answered prior to discharge.         Additional history obtained: -Additional history obtained from na -External records from outside source obtained and reviewed including: Chart review including previous notes, labs, imaging, consultation notes including follows with Dr. Ladona Ridgel cardiology, reviewed cardiology office notes, home medications.   Lab Tests: -I ordered, reviewed, and interpreted labs.   The pertinent results include:   Labs Reviewed  RESP PANEL BY RT-PCR (RSV, FLU A&B, COVID)  RVPGX2 - Abnormal; Notable for the following components:      Result Value    SARS Coronavirus 2 by RT PCR POSITIVE (*)    All other components within normal limits  BASIC METABOLIC PANEL - Abnormal; Notable for the following components:   BUN 21 (*)    Creatinine, Ser 1.41 (*)    All other components within normal limits  CBC WITH DIFFERENTIAL/PLATELET - Abnormal; Notable for the following components:   Platelets 149 (*)    All other components within normal limits  BRAIN NATRIURETIC PEPTIDE - Abnormal; Notable for the following components:   B Natriuretic Peptide 167.0 (*)    All other components within normal limits  TROPONIN I (HIGH SENSITIVITY)  TROPONIN I (HIGH SENSITIVITY)    Notable for covid 19 +tive  EKG   EKG Interpretation Date/Time:  Wednesday November 19 2022 23:14:50 EDT Ventricular Rate:  64 PR Interval:  263 QRS Duration:  132 QT Interval:  435 QTC Calculation: 449 R Axis:   7  Text Interpretation: Sinus rhythm Prolonged PR interval Probable left atrial  enlargement Nonspecific intraventricular conduction delay Minimal ST elevation, inferior leads similar to prior Interpretation limited secondary to artifact Confirmed by Tanda Rockers ([id number]) on 11/19/2022 11:43:37 PM         Imaging Studies ordered: I ordered imaging studies including CXR I independently visualized the following imaging with scope of interpretation limited to determining acute life threatening conditions related to emergency care; findings noted above, significant for stable imaging, no ptx I independently visualized and interpreted imaging. I agree with the radiologist interpretation   Medicines ordered and prescription drug management: Meds ordered this encounter  Medications   sodium chloride 0.9 % bolus 1,000 mL   ipratropium-albuterol (DUONEB) 0.5-2.5 (3) MG/3ML nebulizer solution 3 mL   albuterol (VENTOLIN HFA) 108 (90 Base) MCG/ACT inhaler 1-2 puff   albuterol (VENTOLIN HFA) 108 (90 Base) MCG/ACT inhaler    Sig: Inhale 1-2 puffs into the lungs every 4 (four)  hours as needed for wheezing or shortness of breath.    Dispense:  6.7 each    Refill:  1   albuterol (PROVENTIL) (2.5 MG/3ML) 0.083% nebulizer solution    Sig: Take 3 mLs (2.5 mg total) by nebulization every 6 (six) hours as needed for wheezing or shortness of breath.    Dispense:  75 mL    Refill:  12   molnupiravir EUA (LAGEVRIO) 200 mg CAPS capsule    Sig: Take 4 capsules (800 mg total) by mouth 2 (two) times daily for 5 days.    Dispense:  40 capsule    Refill:  0   guaiFENesin-dextromethorphan (ROBITUSSIN DM) 100-10 MG/5ML syrup    Sig: Take 5 mLs by mouth every 4 (four) hours as needed for cough.    Dispense:  118 mL    Refill:  0    -I have reviewed the patients home medicines and have made adjustments as needed   Consultations Obtained: a   Cardiac Monitoring: The patient was maintained on a cardiac monitor.  I personally viewed and interpreted the cardiac monitored which showed an underlying rhythm of: NSR  Social Determinants of Health:  Diagnosis or treatment significantly limited by social determinants of health: non smoker   Reevaluation: After the interventions noted above, I reevaluated the patient and found that they have improved  Co morbidities that complicate the patient evaluation  Past Medical History:  Diagnosis Date   SVT (supraventricular tachycardia)    EPS by Dr Ladona Ridgel 2007 demonstrated AP near the AV node for which ablation was deferred      Dispostion: Disposition decision including need for hospitalization was considered, and patient discharged from emergency department.    Final Clinical Impression(s) / ED Diagnoses Final diagnoses:  COVID-19  Dyspnea, unspecified type     This chart was dictated using voice recognition software.  Despite best efforts to proofread,  errors can occur which can change the documentation meaning.    Sloan Leiter, DO 11/20/22 [phone]

## 2022-11-19 NOTE — ED Notes (Signed)
Patient transported to X-ray 

## 2022-11-19 NOTE — ED Triage Notes (Signed)
Patient states that his shortness of breath started last night, he states that he woke up in a pool of sweat and thought that he might have the flu.  Patient has had a cough.  Patient also states that he has been having some chest pain with the shortness of breath and cough.  He denies any fever at this time.  Patient states he has an ICD, which has not been going off.

## 2022-11-19 NOTE — ED Notes (Signed)
Dr Gray at bedside  

## 2022-11-19 NOTE — ED Notes (Signed)
[employer] ICD

## 2022-11-20 LAB — RESP PANEL BY RT-PCR (RSV, FLU A&B, COVID)  RVPGX2
Influenza A by PCR: NEGATIVE
Influenza B by PCR: NEGATIVE
Resp Syncytial Virus by PCR: NEGATIVE
SARS Coronavirus 2 by RT PCR: POSITIVE — AB

## 2022-11-20 LAB — CBC WITH DIFFERENTIAL/PLATELET
Abs Immature Granulocytes: 0.01 10*3/uL (ref 0.00–0.07)
Basophils Absolute: 0 10*3/uL (ref 0.0–0.1)
Basophils Relative: 1 %
Eosinophils Absolute: 0.3 10*3/uL (ref 0.0–0.5)
Eosinophils Relative: 7 %
HCT: 43 % (ref 39.0–52.0)
Hemoglobin: 14.4 g/dL (ref 13.0–17.0)
Immature Granulocytes: 0 %
Lymphocytes Relative: 36 %
Lymphs Abs: 1.7 10*3/uL (ref 0.7–4.0)
MCH: 30.9 pg (ref 26.0–34.0)
MCHC: 33.5 g/dL (ref 30.0–36.0)
MCV: 92.3 fL (ref 80.0–100.0)
Monocytes Absolute: 0.7 10*3/uL (ref 0.1–1.0)
Monocytes Relative: 14 %
Neutro Abs: 2 10*3/uL (ref 1.7–7.7)
Neutrophils Relative %: 42 %
Platelets: 149 10*3/uL — ABNORMAL LOW (ref 150–400)
RBC: 4.66 MIL/uL (ref 4.22–5.81)
RDW: 13.6 % (ref 11.5–15.5)
WBC: 4.7 10*3/uL (ref 4.0–10.5)
nRBC: 0 % (ref 0.0–0.2)

## 2022-11-20 LAB — BRAIN NATRIURETIC PEPTIDE: B Natriuretic Peptide: 167 pg/mL — ABNORMAL HIGH (ref 0.0–100.0)

## 2022-11-20 LAB — TROPONIN I (HIGH SENSITIVITY)
Troponin I (High Sensitivity): 4 ng/L (ref <18)
Troponin I (High Sensitivity): 5 ng/L (ref <18)

## 2022-11-20 MED ORDER — ALBUTEROL SULFATE HFA 108 (90 BASE) MCG/ACT IN AERS: 1.0000 | INHALATION_SPRAY | RESPIRATORY_TRACT | 1 refills | Status: AC | PRN

## 2022-11-20 MED ORDER — ALBUTEROL SULFATE (2.5 MG/3ML) 0.083% IN NEBU: 2.5000 mg | INHALATION_SOLUTION | Freq: Four times a day (QID) | RESPIRATORY_TRACT | 12 refills | Status: AC | PRN

## 2022-11-20 MED ORDER — MOLNUPIRAVIR EUA 200MG CAPSULE: 4.0000 | ORAL_CAPSULE | Freq: Two times a day (BID) | ORAL | 0 refills | Status: AC

## 2022-11-20 MED ORDER — ALBUTEROL SULFATE HFA 108 (90 BASE) MCG/ACT IN AERS
1.0000 | INHALATION_SPRAY | RESPIRATORY_TRACT | Status: DC | PRN
  Administered 2022-11-20: 2 via RESPIRATORY_TRACT
  Filled 2022-11-20: qty 6.7

## 2022-11-20 MED ORDER — GUAIFENESIN-DM 100-10 MG/5ML PO SYRP: 5.0000 mL | ORAL_SOLUTION | ORAL | 0 refills | Status: DC | PRN

## 2022-11-20 NOTE — Discharge Instructions (Addendum)
It was a pleasure caring for you today in the emergency department. ° °Please return to the emergency department for any worsening or worrisome symptoms. ° ° °

## 2022-12-01 ENCOUNTER — Telehealth: Payer: Self-pay

## 2022-12-01 NOTE — Telephone Encounter (Signed)
Following alert received from [employer] received for 3 long NSVT arrhythmias detected and one VT episode that had ATP delivered, the arrhythmia does not appear to have been converted with the ATP.  Unclear as to how long the VT lasted and when it converted back to regualr rhythm as shown on presenting EGM.    Diagnosed with covid 11/19/2022. Patient reports he was laying down when sudden onset of dizziness, lightheaded and flushed occurred. Reports he sat up for a few minutes then stopped. Compliant with meds, doses verified. Patient denies any other symptoms since this time and feels well today.   Advised pt [place] DMV driving restrictions x6 months with start date 11/29/22 and shock plan w/ verbal understanding.  Advised I will forward to Dr. Ladona Ridgel for review. Pt voiced understanding.

## 2022-12-03 NOTE — Telephone Encounter (Signed)
Followup as scheduled. He has a lot of this.

## 2022-12-04 DIAGNOSIS — R1013 Epigastric pain: Secondary | ICD-10-CM | POA: Diagnosis not present

## 2022-12-04 DIAGNOSIS — J4530 Mild persistent asthma, uncomplicated: Secondary | ICD-10-CM | POA: Diagnosis not present

## 2022-12-04 DIAGNOSIS — G4700 Insomnia, unspecified: Secondary | ICD-10-CM | POA: Diagnosis not present

## 2022-12-04 DIAGNOSIS — R109 Unspecified abdominal pain: Secondary | ICD-10-CM | POA: Diagnosis not present

## 2022-12-11 ENCOUNTER — Ambulatory Visit (INDEPENDENT_AMBULATORY_CARE_PROVIDER_SITE_OTHER): Payer: BC Managed Care – PPO

## 2022-12-11 DIAGNOSIS — I472 Ventricular tachycardia, unspecified: Secondary | ICD-10-CM | POA: Diagnosis not present

## 2023-01-12 DIAGNOSIS — Z13220 Encounter for screening for lipoid disorders: Secondary | ICD-10-CM | POA: Diagnosis not present

## 2023-01-12 DIAGNOSIS — R739 Hyperglycemia, unspecified: Secondary | ICD-10-CM | POA: Diagnosis not present

## 2023-01-12 DIAGNOSIS — Z125 Encounter for screening for malignant neoplasm of prostate: Secondary | ICD-10-CM | POA: Diagnosis not present

## 2023-01-12 DIAGNOSIS — Z0000 Encounter for general adult medical examination without abnormal findings: Secondary | ICD-10-CM | POA: Diagnosis not present

## 2023-01-12 DIAGNOSIS — R7989 Other specified abnormal findings of blood chemistry: Secondary | ICD-10-CM | POA: Diagnosis not present

## 2023-01-27 NOTE — Progress Notes (Signed)
Remote ICD transmission.   

## 2023-03-12 ENCOUNTER — Ambulatory Visit (INDEPENDENT_AMBULATORY_CARE_PROVIDER_SITE_OTHER): Payer: BC Managed Care – PPO

## 2023-03-12 DIAGNOSIS — I472 Ventricular tachycardia, unspecified: Secondary | ICD-10-CM

## 2023-03-13 LAB — CUP PACEART REMOTE DEVICE CHECK
Battery Remaining Longevity: 64 mo
Battery Remaining Percentage: 62 %
Battery Voltage: 2.96 V
Brady Statistic AP VP Percent: 1 %
Brady Statistic AP VS Percent: 2.2 %
Brady Statistic AS VP Percent: 1 %
Brady Statistic AS VS Percent: 95 %
Brady Statistic RA Percent Paced: 1 %
Brady Statistic RV Percent Paced: 1 %
Date Time Interrogation Session: 20241108015015
HighPow Impedance: 73 Ohm
HighPow Impedance: 73 Ohm
Implantable Lead Connection Status: 753985
Implantable Lead Connection Status: 753985
Implantable Lead Implant Date: 20200804
Implantable Lead Implant Date: 20200804
Implantable Lead Location: 753859
Implantable Lead Location: 753860
Implantable Lead Model: 7122
Implantable Pulse Generator Implant Date: 20200804
Lead Channel Impedance Value: 540 Ohm
Lead Channel Impedance Value: 600 Ohm
Lead Channel Pacing Threshold Amplitude: 0.75 V
Lead Channel Pacing Threshold Amplitude: 0.75 V
Lead Channel Pacing Threshold Pulse Width: 0.5 ms
Lead Channel Pacing Threshold Pulse Width: 0.5 ms
Lead Channel Sensing Intrinsic Amplitude: 11.5 mV
Lead Channel Sensing Intrinsic Amplitude: 5 mV
Lead Channel Setting Pacing Amplitude: 2 V
Lead Channel Setting Pacing Amplitude: 2.5 V
Lead Channel Setting Pacing Pulse Width: 0.5 ms
Lead Channel Setting Sensing Sensitivity: 0.5 mV
Pulse Gen Serial Number: 9903314

## 2023-03-14 ENCOUNTER — Other Ambulatory Visit: Payer: Self-pay | Admitting: Internal Medicine

## 2023-03-14 ENCOUNTER — Other Ambulatory Visit: Payer: Self-pay | Admitting: Student

## 2023-03-25 NOTE — Progress Notes (Signed)
Remote ICD transmission.   

## 2023-06-04 DIAGNOSIS — R519 Headache, unspecified: Secondary | ICD-10-CM | POA: Diagnosis not present

## 2023-06-04 DIAGNOSIS — J069 Acute upper respiratory infection, unspecified: Secondary | ICD-10-CM | POA: Diagnosis not present

## 2023-06-04 DIAGNOSIS — Z20822 Contact with and (suspected) exposure to covid-19: Secondary | ICD-10-CM | POA: Diagnosis not present

## 2023-06-04 DIAGNOSIS — R197 Diarrhea, unspecified: Secondary | ICD-10-CM | POA: Diagnosis not present

## 2023-06-10 ENCOUNTER — Telehealth: Payer: Self-pay | Admitting: Internal Medicine

## 2023-06-10 DIAGNOSIS — Z0279 Encounter for issue of other medical certificate: Secondary | ICD-10-CM

## 2023-06-10 NOTE — Telephone Encounter (Signed)
 Patient came in to sign FMLA and pay $29.

## 2023-06-10 NOTE — Telephone Encounter (Signed)
 Received a [employer] disability form.  I called patient who said that he would be coming in today to pay and sign ROI.

## 2023-06-10 NOTE — Telephone Encounter (Signed)
We will try to assist. GT

## 2023-06-11 ENCOUNTER — Ambulatory Visit (INDEPENDENT_AMBULATORY_CARE_PROVIDER_SITE_OTHER): Payer: BC Managed Care – PPO

## 2023-06-11 DIAGNOSIS — I472 Ventricular tachycardia, unspecified: Secondary | ICD-10-CM | POA: Diagnosis not present

## 2023-06-12 ENCOUNTER — Encounter: Payer: Self-pay | Admitting: Internal Medicine

## 2023-06-12 ENCOUNTER — Telehealth: Payer: Self-pay

## 2023-06-12 LAB — CUP PACEART REMOTE DEVICE CHECK
Battery Remaining Longevity: 62 mo
Battery Remaining Percentage: 60 %
Battery Voltage: 2.96 V
Brady Statistic AP VP Percent: 1 %
Brady Statistic AP VS Percent: 2 %
Brady Statistic AS VP Percent: 1 %
Brady Statistic AS VS Percent: 96 %
Brady Statistic RA Percent Paced: 1 %
Brady Statistic RV Percent Paced: 1 %
Date Time Interrogation Session: 20250206115647
HighPow Impedance: 71 Ohm
HighPow Impedance: 71 Ohm
Implantable Lead Connection Status: 753985
Implantable Lead Connection Status: 753985
Implantable Lead Implant Date: 20200804
Implantable Lead Implant Date: 20200804
Implantable Lead Location: 753859
Implantable Lead Location: 753860
Implantable Lead Model: 7122
Implantable Pulse Generator Implant Date: 20200804
Lead Channel Impedance Value: 530 Ohm
Lead Channel Impedance Value: 560 Ohm
Lead Channel Pacing Threshold Amplitude: 0.75 V
Lead Channel Pacing Threshold Amplitude: 0.75 V
Lead Channel Pacing Threshold Pulse Width: 0.5 ms
Lead Channel Pacing Threshold Pulse Width: 0.5 ms
Lead Channel Sensing Intrinsic Amplitude: 5 mV
Lead Channel Sensing Intrinsic Amplitude: 8.1 mV
Lead Channel Setting Pacing Amplitude: 2 V
Lead Channel Setting Pacing Amplitude: 2.5 V
Lead Channel Setting Pacing Pulse Width: 0.5 ms
Lead Channel Setting Sensing Sensitivity: 0.5 mV
Pulse Gen Serial Number: 9903314

## 2023-06-12 NOTE — Telephone Encounter (Signed)
 Agree with plan

## 2023-06-12 NOTE — Telephone Encounter (Signed)
 Scheduled remote reviewed. Normal device function.   1 VT-NS classified episode on 04/02/23 at 8:56 pm, 4 sec device defined duration, no onset of arrhythmia noted with 15 sec of arrhythmia visible, V>A conduction at 180 bpm, delayed detection recorded due to V rate dropping to 160 bpm and below detection of 176 bpm; routed to clinic for high priority review.  Next remote 91 days.  Spoke with patient.  He has appt with Renee on 07/14/23 ;however, will bring him in to device clinic on Monday to review adjusting his monitoring zones with Dr. Kennyth as Dr. Waddell is out of office this next week.   Patient says he does remember feeling really fast heart rate around that time. Has been doing well since event, no further symptoms.  Has no driving restriction information since November event. Has ER precautions if any symptoms dizzy, syncopal/near syncopal, SOB etc..

## 2023-06-15 ENCOUNTER — Ambulatory Visit: Payer: BC Managed Care – PPO | Attending: Cardiology

## 2023-06-15 DIAGNOSIS — I472 Ventricular tachycardia, unspecified: Secondary | ICD-10-CM

## 2023-06-15 LAB — CUP PACEART INCLINIC DEVICE CHECK
Battery Remaining Longevity: 63 mo
Brady Statistic RA Percent Paced: 0.59 %
Brady Statistic RV Percent Paced: 0.06 %
Date Time Interrogation Session: 20250210192002
HighPow Impedance: 72 Ohm
Implantable Lead Connection Status: 753985
Implantable Lead Connection Status: 753985
Implantable Lead Implant Date: 20200804
Implantable Lead Implant Date: 20200804
Implantable Lead Location: 753859
Implantable Lead Location: 753860
Implantable Lead Model: 7122
Implantable Pulse Generator Implant Date: 20200804
Lead Channel Impedance Value: 550 Ohm
Lead Channel Impedance Value: 575 Ohm
Lead Channel Pacing Threshold Amplitude: 0.75 V
Lead Channel Pacing Threshold Amplitude: 0.75 V
Lead Channel Pacing Threshold Amplitude: 0.75 V
Lead Channel Pacing Threshold Amplitude: 0.75 V
Lead Channel Pacing Threshold Pulse Width: 0.5 ms
Lead Channel Pacing Threshold Pulse Width: 0.5 ms
Lead Channel Pacing Threshold Pulse Width: 0.5 ms
Lead Channel Pacing Threshold Pulse Width: 0.5 ms
Lead Channel Sensing Intrinsic Amplitude: 11.5 mV
Lead Channel Sensing Intrinsic Amplitude: 5 mV
Lead Channel Setting Pacing Amplitude: 2 V
Lead Channel Setting Pacing Amplitude: 2.5 V
Lead Channel Setting Pacing Pulse Width: 0.5 ms
Lead Channel Setting Sensing Sensitivity: 0.5 mV
Pulse Gen Serial Number: 9903314

## 2023-06-15 NOTE — Progress Notes (Signed)
 Patient brought in for review and reprogramming of VT 1 zone. 04/02/23 episode VT event with several beats below VT1 Trigger zone of 176bpm, tx withheld due to lack of detection. Episode rates 160-180bpm.  Pt. remembers being sx during event: lightheaded, dizzy.  Did not pass out.   Reviewed with Dr. Daneil Dunker (Dr. Carolynne Citron out of office).  Given events and consideration for patients age and activity level the following programming changes were made to the VTI ZONE:  1. VT 1 zone lowered detection rate from 176 to 164bpm.   2.  VT 1 zone increased ATP from a total of 6 bursts to 8 bursts.  (2 shots of 4 bursts)  Full interrogation, reprogramming and wavelet update performed by Laverta Potters. [employer] rep today.  Device function is normal, no further episodes/events since November.  Patient doing well.

## 2023-06-18 ENCOUNTER — Other Ambulatory Visit: Payer: Self-pay | Admitting: Student

## 2023-06-29 NOTE — Telephone Encounter (Addendum)
 Paper work completed and turned in.

## 2023-06-30 ENCOUNTER — Telehealth: Payer: Self-pay | Admitting: Internal Medicine

## 2023-06-30 NOTE — Telephone Encounter (Signed)
 Patient says someone from the office called about 40 minutes ago to ask what date he returns to work. He says he returns to work on 3/28.

## 2023-07-01 NOTE — Telephone Encounter (Signed)
 Completed [employer] disability form was faxed to [employer] and scanned into patient's chart. Billing and patient notified.

## 2023-07-11 NOTE — Progress Notes (Unsigned)
 Cardiology Office Note:  .   Date:  07/11/2023  ID:  Xavier [name], DOB 1980/08/18, MRN [id number] PCP: Leilani Able, MD  [facility] Providers Cardiologist:  None {  History of Present Illness: .   Xavier [name] is a 43 y.o. male w/PMHx of SVT, VT   I saw him Nov 2023: had treated VT 190's > ATP > slowed the VT 160's-170's  Reviewed with Dr. Ladona Ridgel --- Discussed reducing his VT zone rate though concerns that as a young man, may then have inappropriate therapies. --- Discussed if his arrhythmia burden increases, that we might consider PET Also noted SB, 1st degree AVBlock  HR histograms OK Low pacing burden > no changes made  Last for EP by Dr. Ladona Ridgel 3/5/24developed progressive conductions system disease  Possible sarcoid - we do not have a tissue diagnosis but his conduction system disease and gadolinium uptake in the base of the septum would suggest this diagnosis  No changes to his meds, Flecainide and BB continued  Today's visit is scheduled as an annual visit  ROS:   *** HR histograms?? *** pacing burden *** VT? SVT? *** flecainide EKG, nodal blocker *** symptoms, syncope   Device information ABBOTT dual chamber ICD implanted 12/07/2018   + appropriate tx nov 2023  Arrhythmia/AAD hx SVT  EPS by Dr Ladona Ridgel 2007 demonstrated AP near the AV node for which ablation was deferred   Developed VT MRI after his EP study and was found to have scar in the base of the septum and ICD implanted   Flecainide looks to go back to 2010 \ Studies Reviewed: Marland Kitchen    EKG done today and reviewed by myself:  ***  DEVICE interrogation done today and reviewed by myself *** Battery and lead measurements are good ***   12/07/2018: c.MRI IMPRESSION: 1. Basal left ventricular septal thinning with mild hypokinesis, and small amount of primarily mid myocardial delayed gadolinium enhancement. These findings suggest possible granulomatous process such as cardiac sarcoidosis.    2. 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   3.  No findings to suggest cardiac amyloidosis or ARVC.     12/04/2018; TTE  1. The left ventricle has normal systolic function, with an ejection  fraction of 55-60%. The cavity size was normal. There is mild concentric  left ventricular hypertrophy. Diastolic dysfunction, grade indeterminate.  No evidence of left ventricular  regional wall motion abnormalities.   2. Left atrial size was mildly dilated.   3. The mitral valve is grossly normal. Mild thickening of the mitral  valve leaflet.   4. The tricuspid valve is grossly normal.   5. The aortic valve is tricuspid.   6. The aorta is normal in size and structure.      Risk Assessment/Calculations:    Physical Exam:   VS:  There were no vitals taken for this visit.   Wt Readings from Last 3 Encounters:  11/19/22 165 lb (74.8 kg)  07/08/22 187 lb 9.6 oz (85.1 kg)  03/07/22 188 lb 12.8 oz (85.6 kg)    GEN: Well nourished, well developed in no acute distress NECK: No JVD; No carotid bruits CARDIAC: ***RRR, no murmurs, rubs, gallops RESPIRATORY:  *** CTA b/l without rales, wheezing or rhonchi  ABDOMEN: Soft, non-tender, non-distended EXTREMITIES: *** No edema; No deformity   ICD site: *** is stable, no thinning, fluctuation, tethering  ASSESSMENT AND PLAN: .    VT Suspect sarcoid with conduction system disease as  well *** Flecainide w/*** *** pacing burden  SVT Remote EPS with AP near the AV node ***  ICD *** intact function *** no programming changes mad      {Are you ordering a CV Procedure (e.g. stress test, cath, DCCV, TEE, etc)?   Press F2        :[id number]}     Dispo: ***  Signed, Sheilah Pigeon, PA-C

## 2023-07-14 ENCOUNTER — Ambulatory Visit: Payer: BC Managed Care – PPO | Attending: Physician Assistant | Admitting: Physician Assistant

## 2023-07-14 ENCOUNTER — Encounter: Payer: Self-pay | Admitting: Physician Assistant

## 2023-07-14 VITALS — BP 100/70 | HR 64 | Ht 69.0 in | Wt 190.8 lb

## 2023-07-14 DIAGNOSIS — I472 Ventricular tachycardia, unspecified: Secondary | ICD-10-CM | POA: Diagnosis not present

## 2023-07-14 DIAGNOSIS — I10 Essential (primary) hypertension: Secondary | ICD-10-CM

## 2023-07-14 DIAGNOSIS — I471 Supraventricular tachycardia, unspecified: Secondary | ICD-10-CM

## 2023-07-14 DIAGNOSIS — Z95810 Presence of automatic (implantable) cardiac defibrillator: Secondary | ICD-10-CM | POA: Diagnosis not present

## 2023-07-14 DIAGNOSIS — I428 Other cardiomyopathies: Secondary | ICD-10-CM

## 2023-07-14 DIAGNOSIS — Z79899 Other long term (current) drug therapy: Secondary | ICD-10-CM

## 2023-07-14 DIAGNOSIS — Z5181 Encounter for therapeutic drug level monitoring: Secondary | ICD-10-CM

## 2023-07-14 LAB — CUP PACEART INCLINIC DEVICE CHECK
Battery Remaining Longevity: 63 mo
Brady Statistic RA Percent Paced: 0.24 %
Brady Statistic RV Percent Paced: 0.03 %
Date Time Interrogation Session: 20250311145409
HighPow Impedance: 66.375
Implantable Lead Connection Status: 753985
Implantable Lead Connection Status: 753985
Implantable Lead Implant Date: 20200804
Implantable Lead Implant Date: 20200804
Implantable Lead Location: 753859
Implantable Lead Location: 753860
Implantable Lead Model: 7122
Implantable Pulse Generator Implant Date: 20200804
Lead Channel Impedance Value: 525 Ohm
Lead Channel Impedance Value: 575 Ohm
Lead Channel Pacing Threshold Amplitude: 0.75 V
Lead Channel Pacing Threshold Amplitude: 0.75 V
Lead Channel Pacing Threshold Amplitude: 0.75 V
Lead Channel Pacing Threshold Amplitude: 0.75 V
Lead Channel Pacing Threshold Pulse Width: 0.5 ms
Lead Channel Pacing Threshold Pulse Width: 0.5 ms
Lead Channel Pacing Threshold Pulse Width: 0.5 ms
Lead Channel Pacing Threshold Pulse Width: 0.5 ms
Lead Channel Sensing Intrinsic Amplitude: 11.5 mV
Lead Channel Sensing Intrinsic Amplitude: 5 mV
Lead Channel Setting Pacing Amplitude: 2 V
Lead Channel Setting Pacing Amplitude: 2.5 V
Lead Channel Setting Pacing Pulse Width: 0.5 ms
Lead Channel Setting Sensing Sensitivity: 0.5 mV
Pulse Gen Serial Number: 9903314

## 2023-07-14 NOTE — Patient Instructions (Signed)
Medication Instructions:   Your physician recommends that you continue on your current medications as directed. Please refer to the Current Medication list given to you today.   *If you need a refill on your cardiac medications before your next appointment, please call your pharmacy*   Lab Work: [place]   If you have labs (blood work) drawn today and your tests are completely normal, you will receive your results only by: [place] (if you have MyChart) OR A paper copy in the mail If you have any lab test that is abnormal or we need to change your treatment, we will call you to review the results.   Testing/Procedures: NONE ORDERED  TODAY     Follow-Up: At [facility], you and your health needs are our priority.  As part of our continuing mission to provide you with exceptional heart care, we have created designated Provider Care Teams.  These Care Teams include your primary Cardiologist (physician) and Advanced Practice Providers (APPs -  Physician Assistants and Nurse Practitioners) who all work together to provide you with the care you need, when you need it.  We recommend signing up for the patient portal called "MyChart".  Sign up information is provided on this After Visit Summary.  MyChart is used to connect with patients for Virtual Visits (Telemedicine).  Patients are able to view lab/test results, encounter notes, upcoming appointments, etc.  Non-urgent messages can be sent to your provider as well.   To learn more about what you can do with MyChart, go to [email or web address].    Your next appointment:   6 month(s)   Provider:   You may see Dr. Lovena Le  or one of the following Advanced Practice Providers on your designated Care Team:   Tommye Standard, [place]   Other Instructions

## 2023-07-16 DIAGNOSIS — J4530 Mild persistent asthma, uncomplicated: Secondary | ICD-10-CM | POA: Diagnosis not present

## 2023-07-16 DIAGNOSIS — G4700 Insomnia, unspecified: Secondary | ICD-10-CM | POA: Diagnosis not present

## 2023-07-16 DIAGNOSIS — M5459 Other low back pain: Secondary | ICD-10-CM | POA: Diagnosis not present

## 2023-07-16 NOTE — Addendum Note (Signed)
 Addended by: Elease Etienne A on: 07/16/2023 11:38 AM   Modules accepted: Orders

## 2023-07-16 NOTE — Progress Notes (Signed)
 Remote ICD transmission.

## 2023-09-10 ENCOUNTER — Ambulatory Visit (INDEPENDENT_AMBULATORY_CARE_PROVIDER_SITE_OTHER): Payer: BC Managed Care – PPO

## 2023-09-10 DIAGNOSIS — I472 Ventricular tachycardia, unspecified: Secondary | ICD-10-CM | POA: Diagnosis not present

## 2023-09-11 LAB — CUP PACEART REMOTE DEVICE CHECK
Battery Remaining Longevity: 60 mo
Battery Remaining Percentage: 58 %
Battery Voltage: 2.95 V
Brady Statistic AP VP Percent: 1 %
Brady Statistic AP VS Percent: 1 %
Brady Statistic AS VP Percent: 1 %
Brady Statistic AS VS Percent: 99 %
Brady Statistic RA Percent Paced: 1 %
Brady Statistic RV Percent Paced: 1 %
Date Time Interrogation Session: 20250508152948
HighPow Impedance: 72 Ohm
HighPow Impedance: 72 Ohm
Implantable Lead Connection Status: 753985
Implantable Lead Connection Status: 753985
Implantable Lead Implant Date: 20200804
Implantable Lead Implant Date: 20200804
Implantable Lead Location: 753859
Implantable Lead Location: 753860
Implantable Lead Model: 7122
Implantable Pulse Generator Implant Date: 20200804
Lead Channel Impedance Value: 540 Ohm
Lead Channel Impedance Value: 590 Ohm
Lead Channel Pacing Threshold Amplitude: 0.75 V
Lead Channel Pacing Threshold Amplitude: 0.75 V
Lead Channel Pacing Threshold Pulse Width: 0.5 ms
Lead Channel Pacing Threshold Pulse Width: 0.5 ms
Lead Channel Sensing Intrinsic Amplitude: 11.7 mV
Lead Channel Sensing Intrinsic Amplitude: 5 mV
Lead Channel Setting Pacing Amplitude: 2 V
Lead Channel Setting Pacing Amplitude: 2.5 V
Lead Channel Setting Pacing Pulse Width: 0.5 ms
Lead Channel Setting Sensing Sensitivity: 0.5 mV
Pulse Gen Serial Number: 9903314

## 2023-09-12 ENCOUNTER — Other Ambulatory Visit: Payer: Self-pay | Admitting: Student

## 2023-09-14 ENCOUNTER — Encounter: Payer: Self-pay | Admitting: Internal Medicine

## 2023-10-02 ENCOUNTER — Other Ambulatory Visit: Payer: Self-pay

## 2023-10-02 MED ORDER — FLECAINIDE ACETATE 100 MG PO TABS: 100.0000 mg | ORAL_TABLET | Freq: Two times a day (BID) | ORAL | 3 refills | Status: DC

## 2023-10-02 MED ORDER — METOPROLOL TARTRATE 50 MG PO TABS: 50.0000 mg | ORAL_TABLET | Freq: Two times a day (BID) | ORAL | 3 refills | Status: AC

## 2023-10-19 NOTE — Addendum Note (Signed)
 Addended by: Lott Rouleau A on: 10/19/2023 11:20 AM   Modules accepted: Orders

## 2023-10-19 NOTE — Progress Notes (Signed)
 Remote ICD transmission.

## 2023-12-10 ENCOUNTER — Ambulatory Visit: Payer: BC Managed Care – PPO

## 2023-12-10 DIAGNOSIS — I472 Ventricular tachycardia, unspecified: Secondary | ICD-10-CM

## 2023-12-11 LAB — CUP PACEART REMOTE DEVICE CHECK
Battery Remaining Longevity: 58 mo
Battery Remaining Percentage: 56 %
Battery Voltage: 2.95 V
Brady Statistic AP VP Percent: 1 %
Brady Statistic AP VS Percent: 1 %
Brady Statistic AS VP Percent: 1 %
Brady Statistic AS VS Percent: 99 %
Brady Statistic RA Percent Paced: 1 %
Brady Statistic RV Percent Paced: 1 %
Date Time Interrogation Session: 20250807120247
HighPow Impedance: 77 Ohm
HighPow Impedance: 77 Ohm
Implantable Lead Connection Status: 753985
Implantable Lead Connection Status: 753985
Implantable Lead Implant Date: 20200804
Implantable Lead Implant Date: 20200804
Implantable Lead Location: 753859
Implantable Lead Location: 753860
Implantable Lead Model: 7122
Implantable Pulse Generator Implant Date: 20200804
Lead Channel Impedance Value: 540 Ohm
Lead Channel Impedance Value: 590 Ohm
Lead Channel Pacing Threshold Amplitude: 0.75 V
Lead Channel Pacing Threshold Amplitude: 0.75 V
Lead Channel Pacing Threshold Pulse Width: 0.5 ms
Lead Channel Pacing Threshold Pulse Width: 0.5 ms
Lead Channel Sensing Intrinsic Amplitude: 11.8 mV
Lead Channel Sensing Intrinsic Amplitude: 5 mV
Lead Channel Setting Pacing Amplitude: 2 V
Lead Channel Setting Pacing Amplitude: 2.5 V
Lead Channel Setting Pacing Pulse Width: 0.5 ms
Lead Channel Setting Sensing Sensitivity: 0.5 mV
Pulse Gen Serial Number: 9903314

## 2023-12-12 ENCOUNTER — Ambulatory Visit: Payer: Self-pay | Admitting: Internal Medicine

## 2023-12-16 ENCOUNTER — Telehealth: Payer: Self-pay

## 2023-12-16 DIAGNOSIS — I472 Ventricular tachycardia, unspecified: Secondary | ICD-10-CM

## 2023-12-16 NOTE — Telephone Encounter (Signed)
 Event occurred 8/12 @ 14:04, duration 22sec, HR 173, V>A, pace terminated with 1 burst of ATP - Route to triage high alert per protocol 1 additional NSVT @ 13:39, duration 14sec, HR 169, self terminated  Patient states he became lightheaded and felt very fast heart rates while working in his house yesterday and had to sit down.  He is taking all of his medications. (Metoprolol /Flecainide ).  No changes in diet, health or meds recently.  Does report in recent days he felt more weak and fatigued like he was getting sick but that improved and currently has no symptoms.   He will go in the morning to get a BMET and a Mag level and will see Daphne Barrack, NP on 8/21 at 8am.   ER precautions given if has severe symptoms: SOB, CP, Palpitations syncopal/near syncopal event and/or shock from his device.  Patient verbalizes understanding and has no further questions or concerns.   [place] DMV driving restrictions given for due to VT with ATP with ICD.  Patient verbalizes understanding.

## 2023-12-17 DIAGNOSIS — I472 Ventricular tachycardia, unspecified: Secondary | ICD-10-CM | POA: Diagnosis not present

## 2023-12-17 NOTE — Telephone Encounter (Signed)
 No change in treatment.

## 2023-12-17 NOTE — Telephone Encounter (Signed)
 He was due for annual follow up first of September.  Will plan on keeping appointment scheduled with Brandi and have updated labs in interim.  Thanks.

## 2023-12-18 LAB — BASIC METABOLIC PANEL WITH GFR
BUN/Creatinine Ratio: 11 (ref 9–20)
BUN: 14 mg/dL (ref 6–24)
CO2: 20 mmol/L (ref 20–29)
Calcium: 9.3 mg/dL (ref 8.7–10.2)
Chloride: 102 mmol/L (ref 96–106)
Creatinine, Ser: 1.33 mg/dL — ABNORMAL HIGH (ref 0.76–1.27)
Glucose: 85 mg/dL (ref 70–99)
Potassium: 4.3 mmol/L (ref 3.5–5.2)
Sodium: 138 mmol/L (ref 134–144)
eGFR: 68 mL/min/1.73 (ref >59)

## 2023-12-18 LAB — MAGNESIUM: Magnesium: 1.9 mg/dL (ref 1.6–2.3)

## 2023-12-22 NOTE — Progress Notes (Unsigned)
 Electrophysiology Office Note:   Date:  12/24/2023  ID:  Chaston Bradburn, DOB [birth year], MRN [id number]  Primary Cardiologist: None Primary Heart Failure: None Electrophysiologist: Danelle Birmingham, MD       History of Present Illness:   Xavier [name] is a 43 y.o. male with h/o infiltrative cardiomyopathy (suspected sarcoidosis based on LGE in base of septum), VT s/p ICD, SVT, HTN, HLD seen today for acute visit due to VT with therapies by device.    Alert received from [employer] of an event on 12/15/23 at 1404 lasting 22 seconds, HR 173, V>A, pace terminated with 1 burst of ATP, additional NSVT lasting 14 sec that self terminated. He was lightheaded and felt fast HR's.  He noted he has been feeling weak/fatigued like he was getting sick.  Follow up labs showed K 4.3, Mg 1.9, Cr 1.33 at baseline.   Patient reports he felt the episodes when they occurred > he became lightheaded and dizzy. He sat down and tried to control it with deep breathing. He is compliant with medications and has not missed any doses. He understands the importance of adherence.      He denies chest pain, palpitations, dyspnea, PND, orthopnea, nausea, vomiting, dizziness, syncope, edema, weight gain, or early satiety.   Review of systems complete and found to be negative unless listed in HPI.    EP Information / Studies Reviewed:    EKG is ordered today. Personal review as below.  EKG Interpretation Date/Time:  Thursday December 24 2023 08:07:03 EDT Ventricular Rate:  60 PR Interval:  284 QRS Duration:  130 QT Interval:  432 QTC Calculation: 432 R Axis:   -12  Text Interpretation: Sinus rhythm with 1st degree A-V block Possible Left atrial enlargement Non-specific intra-ventricular conduction block Confirmed by Aniceto Jarvis ([id number]) on 12/24/2023 8:11:33 AM   ICD Interrogation-  reviewed in detail today,  See PACEART report.  Device History: Abbott Dual Chamber ICD implanted 12/07/2018 for VT History of appropriate  therapy: Yes > 03/2022, 11/2022 (COVID), 03/2023 VT under detection zone History of AAD therapy: Yes; currently on Flecainide  > initiated 2010   Risk Assessment/Calculations:              Physical Exam:   VS:  BP 109/74   Pulse 60   Ht 5' 8 (1.727 m)   Wt 191 lb (86.6 kg)   SpO2 98%   BMI 29.04 kg/m    Wt Readings from Last 3 Encounters:  12/24/23 191 lb (86.6 kg)  07/14/23 190 lb 12.8 oz (86.5 kg)  11/19/22 165 lb (74.8 kg)     GEN: Well nourished, well developed in no acute distress NECK: No JVD; No carotid bruits CARDIAC: Regular rate and rhythm, no murmurs, rubs, gallops, device site wnl / no tethering RESPIRATORY:  Clear to auscultation without rales, wheezing or rhonchi  ABDOMEN: Soft, non-tender, non-distended EXTREMITIES:  No edema; No deformity   ASSESSMENT AND PLAN:    Chronic Systolic Dysfunction s/p Abbott dual chamber ICD  Infiltrative Cardiomyopathy / Suspected Sarcoidosis (by MRI) VT / NSVT  -euvolemic on exam / by device   -Stable on an appropriate medical regimen -Normal ICD function -See Pace Art report -continue flecainide  + metoprolol   -no driving for 6 months from 12/15/23  -post labs reviewed > no significant abnormalities  -will review with Dr. Birmingham re: cPET to review for active sarcoid and referral to AHF Team for possible suppression if warranted -VT-1 zone adjusted to 153 bpm to allow for faster  detection of VT (was in for 30 seconds before met criteria for device to give therapy).  VT zone reviewed with EP MD in clinic before adjustments.   SVT  Remote EPS  -no burden on device      Disposition:   Follow up with Dr. Waddell 02/24/24 as scheduled     Signed, Daphne Barrack, NP-C, AGACNP-BC [facility] - Electrophysiology  12/24/2023, 8:15 AM

## 2023-12-24 ENCOUNTER — Encounter: Payer: Self-pay | Admitting: Pulmonary Disease

## 2023-12-24 ENCOUNTER — Ambulatory Visit: Payer: Self-pay | Attending: Pulmonary Disease | Admitting: Pulmonary Disease

## 2023-12-24 VITALS — BP 109/74 | HR 60 | Ht 68.0 in | Wt 191.0 lb

## 2023-12-24 DIAGNOSIS — I428 Other cardiomyopathies: Secondary | ICD-10-CM | POA: Diagnosis not present

## 2023-12-24 DIAGNOSIS — I471 Supraventricular tachycardia, unspecified: Secondary | ICD-10-CM | POA: Diagnosis not present

## 2023-12-24 DIAGNOSIS — Z95810 Presence of automatic (implantable) cardiac defibrillator: Secondary | ICD-10-CM

## 2023-12-24 DIAGNOSIS — I472 Ventricular tachycardia, unspecified: Secondary | ICD-10-CM

## 2023-12-24 LAB — CUP PACEART INCLINIC DEVICE CHECK
Date Time Interrogation Session: 20250821121846
Implantable Lead Connection Status: 753985
Implantable Lead Connection Status: 753985
Implantable Lead Implant Date: 20200804
Implantable Lead Implant Date: 20200804
Implantable Lead Location: 753859
Implantable Lead Location: 753860
Implantable Lead Model: 7122
Implantable Pulse Generator Implant Date: 20200804
Pulse Gen Serial Number: 9903314

## 2023-12-24 NOTE — Patient Instructions (Signed)
 Medication Instructions:  Your physician recommends that you continue on your current medications as directed. Please refer to the Current Medication list given to you today.  *If you need a refill on your cardiac medications before your next appointment, please call your pharmacy*  Lab Work: None ordered If you have labs (blood work) drawn today and your tests are completely normal, you will receive your results only by: MyChart Message (if you have MyChart) OR A paper copy in the mail If you have any lab test that is abnormal or we need to change your treatment, we will call you to review the results.  Follow-Up: At [facility], you and your health needs are our priority.  As part of our continuing mission to provide you with exceptional heart care, our providers are all part of one team.  This team includes your primary Cardiologist (physician) and Advanced Practice Providers or APPs (Physician Assistants and Nurse Practitioners) who all work together to provide you with the care you need, when you need it.  Your next appointment:   Next available  Provider:   Danelle Birmingham, MD

## 2023-12-30 ENCOUNTER — Telehealth: Payer: Self-pay

## 2023-12-30 NOTE — Telephone Encounter (Signed)
 Alert received from [employer] for Successful ATP therapy delivered. Event occurred 8/25 @ 14:25, duration 28sec, HR 162 , V>A, pace terminated with 2 bursts of ATP. - Route to triage high alert per protocol Pt had 8/21 programming changes.  Patient reports of lightheaded and dizzy during events. Patient report compliant with all medication on file.   Patient advised no driving X6 months per [facility] /shock plan reviewed. Patient voices understanding and appreciative of call.  Has f/u with Dr. Waddell in office 02/24/24, pt aware.   Routing to [place], NP to review.

## 2024-01-06 ENCOUNTER — Telehealth: Payer: Self-pay | Admitting: Pulmonary Disease

## 2024-01-06 DIAGNOSIS — D8685 Sarcoid myocarditis: Secondary | ICD-10-CM

## 2024-01-06 NOTE — Telephone Encounter (Signed)
 Patient seen in clinic on 12/24/23 with ICD check post episodes of VT where he received ATP.  VT zones adjusted. Suspected cardiac sarcoidosis based on LGE in base of septum.     Plan of care reviewed with Dr. Waddell.  Will refer patient for cPET to confirm is he has active sarcoidosis.     Called patient to discuss scan but went to voicemail.  Left message for return call to discuss risks of testing.     Daphne Barrack, NP-C, AGACNP-BC [facility] - Electrophysiology  01/06/2024, 3:47 PM    Received return call to clinic from patient.  Patient called and discussed indications for cPET and risks/benefits.  Indicates understanding and wishes to proceed.  Pt reports he is already scheduled for 02/16/24.     Informed Consent  for Cardiac PET Shared Decision Making/Informed Consent The risks [chest pain, shortness of breath, cardiac arrhythmias, dizziness, blood pressure fluctuations, myocardial infarction, stroke/transient ischemic attack, nausea, vomiting, allergic reaction, radiation exposure, metallic taste sensation and life-threatening complications (estimated to be 1 in 10,000)], benefits (risk stratification, diagnosing coronary artery disease, treatment guidance) and alternatives of a cardiac PET test were discussed in detail with Xavier [name] and he agrees to proceed.   Daphne Barrack, NP-C, AGACNP-BC [facility] - Electrophysiology  01/07/2024, 1:35 PM

## 2024-01-07 NOTE — Telephone Encounter (Signed)
Patient returned NP's call.

## 2024-01-11 ENCOUNTER — Observation Stay (HOSPITAL_BASED_OUTPATIENT_CLINIC_OR_DEPARTMENT_OTHER)
Admission: EM | Admit: 2024-01-11 | Discharge: 2024-01-14 | Disposition: A | Discharge status: Home / Self Care | Attending: Student in an Organized Health Care Education/Training Program | Admitting: Student in an Organized Health Care Education/Training Program

## 2024-01-11 ENCOUNTER — Encounter (HOSPITAL_BASED_OUTPATIENT_CLINIC_OR_DEPARTMENT_OTHER): Payer: Self-pay | Admitting: Emergency Medicine

## 2024-01-11 ENCOUNTER — Other Ambulatory Visit: Payer: Self-pay

## 2024-01-11 ENCOUNTER — Emergency Department (HOSPITAL_BASED_OUTPATIENT_CLINIC_OR_DEPARTMENT_OTHER)

## 2024-01-11 DIAGNOSIS — T82897A Other specified complication of cardiac prosthetic devices, implants and grafts, initial encounter: Secondary | ICD-10-CM | POA: Diagnosis not present

## 2024-01-11 DIAGNOSIS — Z23 Encounter for immunization: Secondary | ICD-10-CM | POA: Diagnosis not present

## 2024-01-11 DIAGNOSIS — R0789 Other chest pain: Secondary | ICD-10-CM | POA: Diagnosis not present

## 2024-01-11 DIAGNOSIS — E785 Hyperlipidemia, unspecified: Secondary | ICD-10-CM | POA: Insufficient documentation

## 2024-01-11 DIAGNOSIS — I4719 Other supraventricular tachycardia: Secondary | ICD-10-CM | POA: Diagnosis not present

## 2024-01-11 DIAGNOSIS — I10 Essential (primary) hypertension: Secondary | ICD-10-CM | POA: Insufficient documentation

## 2024-01-11 DIAGNOSIS — R079 Chest pain, unspecified: Secondary | ICD-10-CM | POA: Diagnosis not present

## 2024-01-11 DIAGNOSIS — Z4502 Encounter for adjustment and management of automatic implantable cardiac defibrillator: Secondary | ICD-10-CM | POA: Diagnosis not present

## 2024-01-11 DIAGNOSIS — R0989 Other specified symptoms and signs involving the circulatory and respiratory systems: Secondary | ICD-10-CM | POA: Diagnosis not present

## 2024-01-11 LAB — CBC
HCT: 45.3 % (ref 39.0–52.0)
Hemoglobin: 14.7 g/dL (ref 13.0–17.0)
MCH: 29.7 pg (ref 26.0–34.0)
MCHC: 32.5 g/dL (ref 30.0–36.0)
MCV: 91.5 fL (ref 80.0–100.0)
Platelets: 160 K/uL (ref 150–400)
RBC: 4.95 MIL/uL (ref 4.22–5.81)
RDW: 14.1 % (ref 11.5–15.5)
WBC: 4.8 K/uL (ref 4.0–10.5)
nRBC: 0 % (ref 0.0–0.2)

## 2024-01-11 LAB — BASIC METABOLIC PANEL WITH GFR
Anion gap: 13 (ref 5–15)
BUN: 19 mg/dL (ref 6–20)
CO2: 20 mmol/L — ABNORMAL LOW (ref 22–32)
Calcium: 9 mg/dL (ref 8.9–10.3)
Chloride: 105 mmol/L (ref 98–111)
Creatinine, Ser: 1.26 mg/dL — ABNORMAL HIGH (ref 0.61–1.24)
GFR, Estimated: >60 mL/min (ref >60)
Glucose, Bld: 109 mg/dL — ABNORMAL HIGH (ref 70–99)
Potassium: 3.7 mmol/L (ref 3.5–5.1)
Sodium: 138 mmol/L (ref 135–145)

## 2024-01-11 LAB — MAGNESIUM: Magnesium: 1.9 mg/dL (ref 1.7–2.4)

## 2024-01-11 LAB — TROPONIN T, HIGH SENSITIVITY
Troponin T High Sensitivity: <15 ng/L (ref 0–19)
Troponin T High Sensitivity: <15 ng/L (ref 0–19)

## 2024-01-11 NOTE — ED Provider Notes (Signed)
  EMERGENCY DEPARTMENT AT [facility] Provider Note   CSN: [id number] Arrival date & time: 01/11/24  8165     Patient presents with: Chest Pain   Xavier [name] is a 43 y.o. male.   43 year old male presents today for concern of shock from his ICD that occurred Thursday night.  Since then he has been feeling chest pain.  Improves with ibuprofen  but the relief does not last that long.  He has had some exertional dyspnea.  Denies any orthopnea.  No significant leg swelling.  He had a similar episode 2 weeks ago where he had a shock from the ICD.  He was seen in the clinic by his cardiologist.  There were discussion for referral to advanced heart failure team for potential suppression in the setting of sarcoid.  He states he reached out to his cardiology clinic via message and they recommended he come into the emergency department.  He had not reached out to them since discharge until last night.  The history is provided by the patient. No language interpreter was used.       Prior to Admission medications   Medication Sig Start Date End Date Taking? Authorizing Provider  ADVAIR DISKUS 100-50 MCG/DOSE AEPB Inhale 1 puff into the lungs Twice daily.  01/06/12   [provider]  albuterol  (PROVENTIL ) (2.5 MG/3ML) 0.083% nebulizer solution Take 3 mLs (2.5 mg total) by nebulization every 6 (six) hours as needed for wheezing or shortness of breath. 11/20/22   Elnor Jayson LABOR, DO  albuterol  (VENTOLIN  HFA) 108 (90 Base) MCG/ACT inhaler Inhale 1-2 puffs into the lungs every 4 (four) hours as needed for wheezing or shortness of breath. 11/20/22   Elnor Jayson LABOR, DO  atorvastatin  (LIPITOR) 20 MG tablet Take 20 mg by mouth daily.  02/26/18   [provider]  flecainide  (TAMBOCOR ) 100 MG tablet Take 1 tablet (100 mg total) by mouth 2 (two) times daily. 10/02/23   Leverne Charlies Helling, PA-C  FLUoxetine  (PROZAC ) 20 MG capsule Take 20 mg by mouth daily.  02/25/18   [provider]  ibuprofen  (ADVIL ) 200 MG tablet Take 400 mg by mouth every 6 (six) hours as needed for headache or moderate pain.    [provider]  magnesium  oxide (MAG-OX) 400 (240 Mg) MG tablet TAKE 1 TABLET BY MOUTH DAILY. 09/16/23   Waddell Danelle ORN, MD  metoprolol  tartrate (LOPRESSOR ) 50 MG tablet Take 1 tablet (50 mg total) by mouth 2 (two) times daily. 10/02/23   Leverne Charlies Helling, PA-C  omeprazole (PRILOSEC) 40 MG capsule Take 40 mg by mouth as needed (for heartburn). 05/30/23   [provider]  PROAIR  HFA 108 (90 BASE) MCG/ACT inhaler Inhale 1-2 puffs into the lungs as needed for wheezing or shortness of breath (Use as directed).  01/06/12   [provider]    Allergies: Patient has no known allergies.    Review of Systems  Constitutional:  Negative for chills and fever.  Respiratory:  Positive for shortness of breath.   Cardiovascular:  Positive for chest pain. Negative for palpitations and leg swelling.  Neurological:  Negative for light-headedness.  All other systems reviewed and are negative.   Updated Vital Signs BP (!) 150/95 (BP Location: Left Arm)   Pulse 68   Temp 97.7 F (36.5 C)   Resp 20   Ht 5' 8 (1.727 m)   Wt 81.6 kg   SpO2 100%   BMI 27.37 kg/m   Physical  Exam Vitals and nursing note reviewed.  Constitutional:      General: He is not in acute distress.    Appearance: Normal appearance. He is not ill-appearing.  HENT:     Head: Normocephalic and atraumatic.     Nose: Nose normal.  Eyes:     Conjunctiva/sclera: Conjunctivae normal.  Cardiovascular:     Rate and Rhythm: Normal rate and regular rhythm.  Pulmonary:     Effort: Pulmonary effort is normal. No respiratory distress.     Breath sounds: No wheezing.  Musculoskeletal:        General: No deformity. Normal range of motion.  Skin:    Findings: No rash.  Neurological:     Mental Status: He is alert.     (all labs ordered are listed, but only abnormal results are  displayed) Labs Reviewed  BASIC METABOLIC PANEL WITH GFR - Abnormal; Notable for the following components:      Result Value   CO2 20 (*)    Glucose, Bld 109 (*)    Creatinine, Ser 1.26 (*)    All other components within normal limits  CBC  MAGNESIUM   TROPONIN T, HIGH SENSITIVITY    EKG: EKG Interpretation Date/Time:  Monday January 11 2024 18:51:53 EDT Ventricular Rate:  69 PR Interval:  244 QRS Duration:  123 QT Interval:  407 QTC Calculation: 436 R Axis:   -4  Text Interpretation: Sinus rhythm Ventricular premature complex Prolonged PR interval Probable left atrial enlargement Nonspecific intraventricular conduction delay Borderline T abnormalities, inferior leads similar to prior EKG Confirmed by Lenor Hollering [phone]) on 01/11/2024 6:57:23 PM  Radiology: [facility] Chest 2 View Result Date: 01/11/2024 EXAM: 2 VIEW(S) XRAY OF THE CHEST 01/11/2024 07:08:00 PM COMPARISON: 11/19/22. CLINICAL HISTORY: Chest pain. Pt states chest pain since last Thursday. Pain is all over. FINDINGS: LUNGS AND PLEURA: Low lung volumes. No focal pulmonary opacity. No pulmonary edema. No pleural effusion. No pneumothorax. HEART AND MEDIASTINUM: No acute abnormality of the cardiac and mediastinal silhouettes. BONES AND SOFT TISSUES: Left chest wall pacemaker defibrillator in place. No acute osseous abnormality. IMPRESSION: 1. No acute cardiopulmonary pathology. Electronically signed by: Norman Gatlin MD 01/11/2024 07:11 PM EDT RP Workstation: [id number]     Procedures   Medications Ordered in the ED - No data to display                                  Medical Decision Making Amount and/or Complexity of Data Reviewed Labs: ordered. Radiology: ordered.  Risk Decision regarding hospitalization.   Medical Decision Making / ED Course   This patient presents to the ED for concern of ICD discharge, this involves an extensive number of treatment options, and is a complaint that carries with it a high  risk of complications and morbidity.  The differential diagnosis includes arrhythmia  MDM: 43 year old male presents today for concern of shock from his ICD that occurred Thursday night.  He did not come earlier because he was busy with work.  He reached out to the cardiology clinic today and was told to come into the nearest emergency department.  Has ongoing chest pain.  States this is unusual following ICD discharge in the past.  Gets better with ibuprofen .  Some exertional dyspnea.  No signs of volume overload on exam.  Hemodynamically stable.  CBC unremarkable, BMP with creatinine of 1.26 otherwise without acute concern.  Troponin negative.  Chest x-ray without  acute cardiopulmonary process.  EKG without acute ischemic change.  Discussed with Dr. Orlando who accepts patient for admission to cardiology service.  Requested we put the admission under Dr. Katharina name since he will be working tomorrow on the service.  Admission orders placed.   Additional history obtained: -Additional history obtained from chart review -External records from outside source obtained and reviewed including: Chart review including previous notes, labs, imaging, consultation notes   Lab Tests: -I ordered, reviewed, and interpreted labs.   The pertinent results include:   Labs Reviewed  BASIC METABOLIC PANEL WITH GFR - Abnormal; Notable for the following components:      Result Value   CO2 20 (*)    Glucose, Bld 109 (*)    Creatinine, Ser 1.26 (*)    All other components within normal limits  CBC  MAGNESIUM   TROPONIN T, HIGH SENSITIVITY  TROPONIN T, HIGH SENSITIVITY      EKG  EKG Interpretation Date/Time:  Monday January 11 2024 18:51:53 EDT Ventricular Rate:  69 PR Interval:  244 QRS Duration:  123 QT Interval:  407 QTC Calculation: 436 R Axis:   -4  Text Interpretation: Sinus rhythm Ventricular premature complex Prolonged PR interval Probable left atrial enlargement Nonspecific  intraventricular conduction delay Borderline T abnormalities, inferior leads similar to prior EKG Confirmed by Lenor Hollering [phone]) on 01/11/2024 6:57:23 PM         Imaging Studies ordered: I ordered imaging studies including chest x-ray I independently visualized and interpreted imaging. I agree with the radiologist interpretation   Medicines ordered and prescription drug management: No orders of the defined types were placed in this encounter.   -I have reviewed the patients home medicines and have made adjustments as needed   Consultations Obtained: I requested consultation with the Dr. Orlando,  and discussed lab and imaging findings as well as pertinent plan - they recommend: As above   Cardiac Monitoring: The patient was maintained on a cardiac monitor.  I personally viewed and interpreted the cardiac monitored which showed an underlying rhythm of: Normal sinus rhythm  Reevaluation: After the interventions noted above, I reevaluated the patient and found that they have :stayed the same  Co morbidities that complicate the patient evaluation  Past Medical History:  Diagnosis Date   SVT (supraventricular tachycardia) (HCC)    EPS by Dr Waddell 2007 demonstrated AP near the AV node for which ablation was deferred      Dispostion: Discussed with cardiologist.  They will admit patient to their service.  Admission orders placed.  Final diagnoses:  ICD (implantable cardioverter-defibrillator) discharge    ED Discharge Orders     None          Hildegard Loge, PA-C 01/11/24 2057    Lenor Hollering, MD 01/11/24 2310

## 2024-01-11 NOTE — ED Triage Notes (Signed)
 Pt c/o chest pain since Thursday night, reports ICD fired Thursday night. Also reports Shob for same time. Reports abd swelling.

## 2024-01-11 NOTE — Telephone Encounter (Signed)
 Spoke with patient and updated him.  See telephone note.   Daphne Barrack, NP-C, AGACNP-BC [facility] - Electrophysiology  01/11/2024, 7:09 AM

## 2024-01-11 NOTE — ED Notes (Signed)
Called lab, will add on Mag

## 2024-01-12 DIAGNOSIS — R079 Chest pain, unspecified: Secondary | ICD-10-CM | POA: Diagnosis present

## 2024-01-12 DIAGNOSIS — Z4502 Encounter for adjustment and management of automatic implantable cardiac defibrillator: Secondary | ICD-10-CM | POA: Diagnosis not present

## 2024-01-12 DIAGNOSIS — Z743 Need for continuous supervision: Secondary | ICD-10-CM | POA: Diagnosis not present

## 2024-01-12 DIAGNOSIS — R0789 Other chest pain: Secondary | ICD-10-CM | POA: Diagnosis not present

## 2024-01-12 LAB — CBC
HCT: 43.2 % (ref 39.0–52.0)
Hemoglobin: 14.3 g/dL (ref 13.0–17.0)
MCH: 30.5 pg (ref 26.0–34.0)
MCHC: 33.1 g/dL (ref 30.0–36.0)
MCV: 92.1 fL (ref 80.0–100.0)
Platelets: 149 K/uL — ABNORMAL LOW (ref 150–400)
RBC: 4.69 MIL/uL (ref 4.22–5.81)
RDW: 13.8 % (ref 11.5–15.5)
WBC: 5.1 K/uL (ref 4.0–10.5)
nRBC: 0 % (ref 0.0–0.2)

## 2024-01-12 LAB — CREATININE, SERUM
Creatinine, Ser: 1.37 mg/dL — ABNORMAL HIGH (ref 0.61–1.24)
GFR, Estimated: >60 mL/min (ref >60)

## 2024-01-12 LAB — HIV ANTIBODY (ROUTINE TESTING W REFLEX): HIV Screen 4th Generation wRfx: NONREACTIVE

## 2024-01-12 MED ORDER — KETOROLAC TROMETHAMINE 15 MG/ML IJ SOLN
15.0000 mg | Freq: Once | INTRAMUSCULAR | Status: AC
  Administered 2024-01-12: 15 mg via INTRAVENOUS
  Filled 2024-01-12: qty 1

## 2024-01-12 MED ORDER — SODIUM CHLORIDE 0.9 % IV SOLN: 250.0000 mL | INTRAVENOUS | Status: AC | PRN

## 2024-01-12 MED ORDER — ALPRAZOLAM 0.25 MG PO TABS: 0.2500 mg | ORAL_TABLET | Freq: Two times a day (BID) | ORAL | Status: DC | PRN

## 2024-01-12 MED ORDER — ALBUTEROL SULFATE (2.5 MG/3ML) 0.083% IN NEBU: 3.0000 mL | INHALATION_SOLUTION | RESPIRATORY_TRACT | Status: DC | PRN

## 2024-01-12 MED ORDER — NITROGLYCERIN 0.4 MG SL SUBL: 0.4000 mg | SUBLINGUAL_TABLET | SUBLINGUAL | Status: DC | PRN

## 2024-01-12 MED ORDER — SODIUM CHLORIDE 0.9% FLUSH: 3.0000 mL | INTRAVENOUS | Status: DC | PRN

## 2024-01-12 MED ORDER — ACETAMINOPHEN 325 MG PO TABS: 650.0000 mg | ORAL_TABLET | ORAL | Status: DC | PRN

## 2024-01-12 MED ORDER — ZOLPIDEM TARTRATE 5 MG PO TABS
5.0000 mg | ORAL_TABLET | Freq: Every evening | ORAL | Status: DC | PRN
  Filled 2024-01-12: qty 1

## 2024-01-12 MED ORDER — ENOXAPARIN SODIUM 40 MG/0.4ML IJ SOSY
40.0000 mg | PREFILLED_SYRINGE | INTRAMUSCULAR | Status: DC
  Administered 2024-01-12 – 2024-01-13 (×2): 40 mg via SUBCUTANEOUS
  Filled 2024-01-12 (×2): qty 0.4

## 2024-01-12 MED ORDER — INFLUENZA VIRUS VACC SPLIT PF (FLUZONE) 0.5 ML IM SUSY
0.5000 mL | PREFILLED_SYRINGE | INTRAMUSCULAR | Status: AC
  Administered 2024-01-13: 0.5 mL via INTRAMUSCULAR
  Filled 2024-01-12: qty 0.5

## 2024-01-12 MED ORDER — METOPROLOL TARTRATE 50 MG PO TABS
50.0000 mg | ORAL_TABLET | Freq: Two times a day (BID) | ORAL | Status: DC
  Administered 2024-01-12 – 2024-01-14 (×4): 50 mg via ORAL
  Filled 2024-01-12 (×5): qty 1

## 2024-01-12 MED ORDER — ONDANSETRON HCL 4 MG/2ML IJ SOLN: 4.0000 mg | Freq: Four times a day (QID) | INTRAMUSCULAR | Status: DC | PRN

## 2024-01-12 MED ORDER — FLECAINIDE ACETATE 50 MG PO TABS
100.0000 mg | ORAL_TABLET | Freq: Two times a day (BID) | ORAL | Status: DC
  Administered 2024-01-12 – 2024-01-14 (×4): 100 mg via ORAL
  Filled 2024-01-12 (×4): qty 2

## 2024-01-12 MED ORDER — MAGNESIUM OXIDE -MG SUPPLEMENT 400 (240 MG) MG PO TABS
400.0000 mg | ORAL_TABLET | Freq: Every day | ORAL | Status: DC
  Administered 2024-01-13 – 2024-01-14 (×2): 400 mg via ORAL
  Filled 2024-01-12 (×2): qty 1

## 2024-01-12 MED ORDER — FLUTICASONE FUROATE-VILANTEROL 100-25 MCG/ACT IN AEPB
1.0000 | INHALATION_SPRAY | Freq: Every day | RESPIRATORY_TRACT | Status: DC
  Administered 2024-01-13 – 2024-01-14 (×2): 1 via RESPIRATORY_TRACT
  Filled 2024-01-12: qty 28

## 2024-01-12 MED ORDER — ATORVASTATIN CALCIUM 10 MG PO TABS
20.0000 mg | ORAL_TABLET | Freq: Every day | ORAL | Status: DC
  Administered 2024-01-13 – 2024-01-14 (×2): 20 mg via ORAL
  Filled 2024-01-12 (×3): qty 2

## 2024-01-12 MED ORDER — SODIUM CHLORIDE 0.9% FLUSH
3.0000 mL | Freq: Two times a day (BID) | INTRAVENOUS | Status: DC
  Administered 2024-01-13 (×2): 3 mL via INTRAVENOUS

## 2024-01-12 MED ORDER — PANTOPRAZOLE SODIUM 40 MG PO TBEC
40.0000 mg | DELAYED_RELEASE_TABLET | Freq: Every day | ORAL | Status: DC
  Administered 2024-01-12 – 2024-01-14 (×3): 40 mg via ORAL
  Filled 2024-01-12 (×3): qty 1

## 2024-01-12 NOTE — ED Provider Notes (Signed)
 Awaiting bed assignment for admission.  Patient reports he has chest pain.  He reports has been constant for days.  This is not sudden or changing overnight. Physical Exam  BP 118/75   Pulse (!) 59   Temp 98 F (36.7 C) (Oral)   Resp 19   Ht 5' 8 (1.727 m)   Wt 81.6 kg   SpO2 100%   BMI 27.37 kg/m   Physical Exam Patient is well in appearance.  No respiratory distress.  Reproducible chest wall pain. Procedures  Procedures  ED Course / MDM    Medical Decision Making Amount and/or Complexity of Data Reviewed Labs: ordered. Radiology: ordered.  Risk Prescription drug management. Decision regarding hospitalization.   Patient has been assessed.  This appears to be chest wall pain and has been consistent.  This is not an acute change.  Vital signs are stable.  I do not think she needs repeat imaging or additional testing at this time.  Will administer Toradol  for what appears consistent with chest wall pain.       Armenta Canning, MD 01/12/24 [phone]

## 2024-01-12 NOTE — Progress Notes (Signed)
 Received patient as direct admit from [facility] via patient transport. Placed on bedside monitor. Brief assessment completed. Verbalizes 4/10 chest wall pain. Dr Lonni notified on pt arrival. Awaiting orders.

## 2024-01-12 NOTE — Progress Notes (Signed)
 Chris from [facility] called to give report from ICD interrogation done at bedside by Shona Shad PA. ICD totally normal, No shocks, episode of VT August 25 2 rounds of ATP rate 162 bpm, pacing 0 %, last cleared Aug 21. Report faxed to ED.   Rhonda notified of above verbally by me, and states she will go to ED to find printed report.

## 2024-01-12 NOTE — H&P (Signed)
 Cardiology Admission History and Physical   Patient ID: [name] MRN: [id number]; DOB: [birth year]   Admission date: 01/11/2024  PCP:  Ilah Crigler, MD   [facility] Providers Cardiologist:  None  Electrophysiologist:  Danelle Birmingham, MD      Chief Complaint:  AICD Shocks  Patient Profile: Xavier [name] is a 43 y.o. male with  infiltrative cardiomyopathy (suspected sarcoidosis based on LGE in base of septum), VT s/p ICD, SVT, HTN, HLD, who is being seen 01/12/2024 for the evaluation of AICD Shocks.  History of Present Illness: Xavier [name] was seen in clinic on 12/24/23 with ICD check post episodes of VT where he received ATP. VT zones adjusted.   Because of suspected sarcoid, he was referred for cPET to confirm the sarcoid. This is scheduled for 10/14.   Pt came [facility]/08 because of CP, ICD firing on 09/04, SOB.  Pt had developed CP last Thurs, describes it as sharp, worse w/ chest palpation. Also some SOB.  His ICD only fired once, he denies any other palpitations, no presyncope or syncope.  When his chest pain did not resolve, he called the office yesterday and came to the ER as requested.   In the hospital, he has been in SR w/ some PVCs.   His chest pain has been continuous since it started, he believes it started before the ICD shock and not as a result but is not sure.   His HR is currently in the 50s, he says that is normal on the medication and is having no sx.    Past Medical History:  Diagnosis Date   SVT (supraventricular tachycardia) (HCC)    EPS by Dr Birmingham 2007 demonstrated AP near the AV node for which ablation was deferred   Past Surgical History:  Procedure Laterality Date   ELECTROPHYSIOLOGIC STUDY  2007   EP study by Dr Birmingham revealed an apparent unusual accessory pathway for which ablation was deferred due to close proximity to the AV node   ICD IMPLANT N/A 12/07/2018   Procedure: ICD IMPLANT;  Surgeon: Birmingham Danelle ORN, MD;  Location:  [facility] INVASIVE CV LAB;  Service: Cardiovascular;  Laterality: N/A;   LEG SURGERY     broken leg   STOMACH SURGERY     SVT ABLATION N/A 12/06/2018   Procedure: SVT ABLATION;  Surgeon: Birmingham Danelle ORN, MD;  Location: [facility] INVASIVE CV LAB;  Service: Cardiovascular;  Laterality: N/A;     Medications Prior to Admission: Prior to Admission medications   Medication Sig Start Date End Date Taking? Authorizing Provider  ADVAIR DISKUS 100-50 MCG/DOSE AEPB Inhale 1 puff into the lungs Twice daily.  01/06/12   [provider]  albuterol  (PROVENTIL ) (2.5 MG/3ML) 0.083% nebulizer solution Take 3 mLs (2.5 mg total) by nebulization every 6 (six) hours as needed for wheezing or shortness of breath. 11/20/22   Elnor Jayson LABOR, DO  albuterol  (VENTOLIN  HFA) 108 (90 Base) MCG/ACT inhaler Inhale 1-2 puffs into the lungs every 4 (four) hours as needed for wheezing or shortness of breath. 11/20/22   Elnor Jayson LABOR, DO  atorvastatin  (LIPITOR) 20 MG tablet Take 20 mg by mouth daily.  02/26/18   [provider]  flecainide  (TAMBOCOR ) 100 MG tablet Take 1 tablet (100 mg total) by mouth 2 (two) times daily. 10/02/23   Leverne Charlies Helling, PA-C  FLUoxetine  (PROZAC ) 20 MG capsule Take 20 mg by mouth daily.  02/25/18   [provider]  ibuprofen  (ADVIL ) 200  MG tablet Take 400 mg by mouth every 6 (six) hours as needed for headache or moderate pain.    [provider]  magnesium  oxide (MAG-OX) 400 (240 Mg) MG tablet TAKE 1 TABLET BY MOUTH DAILY. 09/16/23   Waddell Danelle ORN, MD  metoprolol  tartrate (LOPRESSOR ) 50 MG tablet Take 1 tablet (50 mg total) by mouth 2 (two) times daily. 10/02/23   Leverne Charlies Helling, PA-C  omeprazole (PRILOSEC) 40 MG capsule Take 40 mg by mouth as needed (for heartburn). 05/30/23   [provider]  PROAIR  HFA 108 (90 BASE) MCG/ACT inhaler Inhale 1-2 puffs into the lungs as needed for wheezing or shortness of breath (Use as directed).  01/06/12   [provider]      Allergies:   No Known Allergies  Social History:   Social History   Socioeconomic History   Marital status: Single    Spouse name: Not on file   Number of children: 3   Years of education: Not on file   Highest education level: Not on file  Occupational History   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  Tobacco Use   Smoking status: Never   Smokeless tobacco: Never  Vaping Use   Vaping status: Never Used  Substance and Sexual Activity   Alcohol use: No   Drug use: No   Sexual activity: Not on file  Other Topics Concern   Not on file  Social History Narrative   Not on file   Social Drivers of Health   Financial Resource Strain: Not on file  Food Insecurity: Not on file  Transportation Needs: Not on file  Physical Activity: Not on file  Stress: Not on file  Social Connections: Not on file  Intimate Partner Violence: Not on file     Family History:   The patient's family history includes Heart disease in his maternal grandmother.    ROS:  Please see the history of present illness.  All other ROS reviewed and negative.     Physical Exam/Data: Vitals:   01/12/24 1206 01/12/24 1230 01/12/24 1602 01/12/24 1630  BP:  119/85 116/78 128/80  Pulse:  (!) 57 (!) 54 (!) 52  Resp:  16 18 14   Temp: 97.6 F (36.4 C) 97.6 F (36.4 C) 98 F (36.7 C)   TempSrc: Oral  Oral   SpO2:  100% 100% 99%  Weight:      Height:       No intake or output data in the 24 hours ending 01/12/24 1806    01/11/2024    6:48 PM 12/24/2023    8:02 AM 07/14/2023   11:23 AM  Last 3 Weights  Weight (lbs) 180 lb 191 lb 190 lb 12.8 oz  Weight (kg) 81.647 kg 86.637 kg 86.546 kg     Body mass index is 27.37 kg/m.  General:  Well nourished, well developed, in no acute distress HEENT: normal Neck: no JVD Vascular: No carotid bruits; Distal pulses 2+ bilaterally   Cardiac:  normal S1, S2; RRR; no murmur  Lungs:  clear to auscultation bilaterally, no wheezing, rhonchi or rales  Abd: soft, nontender,  no hepatomegaly  Ext: no edema Musculoskeletal:  No deformities, BUE and BLE strength normal and equal Skin: warm and dry  Neuro:  CNs 2-12 intact, no focal abnormalities noted Psych:  Normal affect   EKG:  The ECG that was done 09/08 was personally reviewed and demonstrates SR, QRS duration 123 ms, occ PVCs  Relevant CV Studies: 12/07/2018: c.MRI  IMPRESSION: 1. Basal left ventricular septal thinning with mild hypokinesis, and small amount of primarily mid myocardial delayed gadolinium enhancement. These findings suggest possible granulomatous process such as cardiac sarcoidosis.  2. 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  3.  No findings to suggest cardiac amyloidosis or ARVC.    12/04/2018; TTE  1. The left ventricle has normal systolic function, with an ejection  fraction of 55-60%. The cavity size was normal. There is mild concentric  left ventricular hypertrophy. Diastolic dysfunction, grade indeterminate.  No evidence of left ventricular  regional wall motion abnormalities.   2. Left atrial size was mildly dilated.   3. The mitral valve is grossly normal. Mild thickening of the mitral  valve leaflet.   4. The tricuspid valve is grossly normal.   5. The aortic valve is tricuspid.   6. The aorta is normal in size and structure.   Laboratory Data: High Sensitivity Troponin:  No results for input(s): TROPONINIHS in the last 720 hours.    Chemistry Recent Labs  Lab 01/11/24 1854  NA 138  K 3.7  CL 105  CO2 20*  GLUCOSE 109*  BUN 19  CREATININE 1.26*  CALCIUM  9.0  MG 1.9  GFRNONAA >60  ANIONGAP 13    No results for input(s): PROT, ALBUMIN, AST, ALT, ALKPHOS, BILITOT in the last 168 hours. Lipids No results for input(s): CHOL, TRIG, HDL, LABVLDL, LDLCALC, CHOLHDL in the last 168 hours. Hematology Recent Labs  Lab 01/11/24 1854  WBC 4.8  RBC 4.95  HGB 14.7  HCT 45.3  MCV 91.5   MCH 29.7  MCHC 32.5  RDW 14.1  PLT 160   Thyroid  No results for input(s): TSH, FREET4 in the last 168 hours. BNPNo results for input(s): BNP, PROBNP in the last 168 hours.  DDimer No results for input(s): DDIMER in the last 168 hours.  Radiology/Studies:  DG Chest 2 View Result Date: 01/11/2024 EXAM: 2 VIEW(S) XRAY OF THE CHEST 01/11/2024 07:08:00 PM COMPARISON: 11/19/22. CLINICAL HISTORY: Chest pain. Pt states chest pain since last Thursday. Pain is all over. FINDINGS: LUNGS AND PLEURA: Low lung volumes. No focal pulmonary opacity. No pulmonary edema. No pleural effusion. No pneumothorax. HEART AND MEDIASTINUM: No acute abnormality of the cardiac and mediastinal silhouettes. BONES AND SOFT TISSUES: Left chest wall pacemaker defibrillator in place. No acute osseous abnormality. IMPRESSION: 1. No acute cardiopulmonary pathology. Electronically signed by: Norman Gatlin MD 01/11/2024 07:11 PM EDT RP Workstation: [id number]     Assessment and Plan: Chest pain: - Pleuritic in nature, improved w/ Toradol  - no CK, troponin nl x 1, MD advise if additional labs needed - no hx stress test/cath, no hx exertional sx - MD advise on further testing  2. ICD d/c - Pt states ICD fired 09/04 - need device interrogation, will try to obtain   Risk Assessment/Risk Scores:      Code Status: Full Code  Severity of Illness: The appropriate patient status for this patient is OBSERVATION. Observation status is judged to be reasonable and necessary in order to provide the required intensity of service to ensure the patient's safety. The patient's presenting symptoms, physical exam findings, and initial radiographic and laboratory data in the context of their medical condition is felt to place them at decreased risk for further clinical deterioration. Furthermore, it is anticipated that the patient will be medically stable for discharge from the hospital within 2 midnights of admission.   For  questions or updates,  please contact [facility] Please consult [email or web address] for contact info under     Signed, Shona Shad, PA-C  01/12/2024 6:06 PM

## 2024-01-13 DIAGNOSIS — R079 Chest pain, unspecified: Secondary | ICD-10-CM

## 2024-01-13 DIAGNOSIS — Z4502 Encounter for adjustment and management of automatic implantable cardiac defibrillator: Secondary | ICD-10-CM | POA: Diagnosis not present

## 2024-01-13 DIAGNOSIS — I471 Supraventricular tachycardia, unspecified: Secondary | ICD-10-CM

## 2024-01-13 DIAGNOSIS — Z95810 Presence of automatic (implantable) cardiac defibrillator: Secondary | ICD-10-CM

## 2024-01-13 LAB — CBC
HCT: 42.3 % (ref 39.0–52.0)
Hemoglobin: 13.8 g/dL (ref 13.0–17.0)
MCH: 30.1 pg (ref 26.0–34.0)
MCHC: 32.6 g/dL (ref 30.0–36.0)
MCV: 92.4 fL (ref 80.0–100.0)
Platelets: 138 K/uL — ABNORMAL LOW (ref 150–400)
RBC: 4.58 MIL/uL (ref 4.22–5.81)
RDW: 14.1 % (ref 11.5–15.5)
WBC: 4.1 K/uL (ref 4.0–10.5)
nRBC: 0 % (ref 0.0–0.2)

## 2024-01-13 LAB — BASIC METABOLIC PANEL WITH GFR
Anion gap: 8 (ref 5–15)
BUN: 22 mg/dL — ABNORMAL HIGH (ref 6–20)
CO2: 21 mmol/L — ABNORMAL LOW (ref 22–32)
Calcium: 8.7 mg/dL — ABNORMAL LOW (ref 8.9–10.3)
Chloride: 107 mmol/L (ref 98–111)
Creatinine, Ser: 1.28 mg/dL — ABNORMAL HIGH (ref 0.61–1.24)
GFR, Estimated: >60 mL/min (ref >60)
Glucose, Bld: 103 mg/dL — ABNORMAL HIGH (ref 70–99)
Potassium: 4.1 mmol/L (ref 3.5–5.1)
Sodium: 136 mmol/L (ref 135–145)

## 2024-01-13 LAB — MAGNESIUM: Magnesium: 1.7 mg/dL (ref 1.7–2.4)

## 2024-01-13 LAB — SEDIMENTATION RATE: Sed Rate: 1 mm/h (ref 0–16)

## 2024-01-13 LAB — C-REACTIVE PROTEIN: CRP: 1.2 mg/dL — ABNORMAL HIGH (ref <1.0)

## 2024-01-13 MED ORDER — KETOROLAC TROMETHAMINE 15 MG/ML IJ SOLN
15.0000 mg | Freq: Once | INTRAMUSCULAR | Status: AC
  Administered 2024-01-13: 15 mg via INTRAVENOUS
  Filled 2024-01-13: qty 1

## 2024-01-13 NOTE — Plan of Care (Signed)

## 2024-01-13 NOTE — TOC CM/SW Note (Signed)
 Transition of Care [facility]) - Inpatient Brief Assessment   Patient Details  Name: Xavier [name] MRN: [id number] Date of Birth: [birth year]  Transition of Care [facility]) CM/SW Contact:    Sudie Erminio Deems, RN Phone Number: 01/13/2024, 2:34 PM   Clinical Narrative: Patient presented for ICD shocks. PTA patient was from home with significant other. Patient has PCP and has no issues with transportation home. No home needs identified at this time.     Transition of Care Asessment: Insurance and Status: Insurance coverage has been reviewed Patient has primary care physician: Yes Home environment has been reviewed: reviewed Prior level of function:: independent Prior/Current Home Services: No current home services Social Drivers of Health Review: SDOH reviewed no interventions necessary Readmission risk has been reviewed: Yes Transition of care needs: no transition of care needs at this time

## 2024-01-13 NOTE — Progress Notes (Signed)
 Pt has had a few infrequent 3-5 bts of non sustained VT throughout this shift. Please see saved strips. Pt has been asymptomatic and resting throughout this shift. Shona Tawni Jansky, RN

## 2024-01-13 NOTE — Consult Note (Addendum)
 ELECTROPHYSIOLOGY CONSULT NOTE    Patient ID: Yosef Krogh MRN: [id number], DOB/AGE: 11-09-1980 43 y.o.  Admit date: 01/11/2024 Date of Consult: 01/13/2024  Primary Physician: Ilah Crigler, MD Primary Cardiologist: None  Electrophysiologist: Dr. Waddell   Patient Profile: Amillion Macchia is a 43 y.o. male with a history of infiltrative cardiomyopathy (suspected sarcoid), VT s/p ICD, SVT, HTN, HLD who is being seen today for the evaluation of VT with ICD shock at the request of Dr. Mona.  HPI:  Ebrahim Deremer is a 43 y.o. male with PMH as above who is well-known to EP team.   ICD implanted 2020 with history of appropriate ATP therapy. He is on flecainide  since 2010.  He was seen by NP Aniceto 8/21 for VT with ATP x 1, then SVT, symptomatic. Labs stable at that appt. VT zone adjusted, planning for cPET to confirm sarcoid diagnosis.   He presented to [facility] ER 9/8 with c/o chest pain, SOB, HV therapy, and transferred to [facility] for further evaluation.   Patient states that he began having chest pain Thursday of last week (9/4), does not significantly worsen or lessen in severtiy, remains constant. It was maybe worsening with deep breathing, so he used his PRN inhaler without improvement.  When chest pain persisted, he called device clinic who recommended he proceed to ER for further evaluation. He has continued to have chest pain since admission, not relieved with PRN tylenol .   He denies chest pressure, palpitations, dizziness, LH or presyncope.   Labs Potassium4.1 (09/10 0359) Magnesium   1.7 (09/10 0359) Creatinine, ser  1.28* (09/10 0359) PLT  138* (09/10 0359) HGB  13.8 (09/10 0359) WBC 4.1 (09/10 0359)  .    Past Medical History:  Diagnosis Date   SVT (supraventricular tachycardia) (HCC)    EPS by Dr Waddell 2007 demonstrated AP near the AV node for which ablation was deferred     Surgical History:  Past Surgical History:  Procedure Laterality Date   ELECTROPHYSIOLOGIC STUDY   2007   EP study by Dr Waddell revealed an apparent unusual accessory pathway for which ablation was deferred due to close proximity to the AV node   ICD IMPLANT N/A 12/07/2018   Procedure: ICD IMPLANT;  Surgeon: Waddell Danelle ORN, MD;  Location: [facility] INVASIVE CV LAB;  Service: Cardiovascular;  Laterality: N/A;   LEG SURGERY     broken leg   STOMACH SURGERY     SVT ABLATION N/A 12/06/2018   Procedure: SVT ABLATION;  Surgeon: Waddell Danelle ORN, MD;  Location: [facility] INVASIVE CV LAB;  Service: Cardiovascular;  Laterality: N/A;     Medications Prior to Admission  Medication Sig Dispense Refill Last Dose/Taking   ADVAIR DISKUS 100-50 MCG/DOSE AEPB Inhale 2 puffs into the lungs Twice daily.   01/08/2024   albuterol  (PROVENTIL ) (2.5 MG/3ML) 0.083% nebulizer solution Take 3 mLs (2.5 mg total) by nebulization every 6 (six) hours as needed for wheezing or shortness of breath. 75 mL 12 Past Month   albuterol  (VENTOLIN  HFA) 108 (90 Base) MCG/ACT inhaler Inhale 1-2 puffs into the lungs every 4 (four) hours as needed for wheezing or shortness of breath. 6.7 each 1 Unknown   atorvastatin  (LIPITOR) 20 MG tablet Take 20 mg by mouth daily.    01/10/2024   flecainide  (TAMBOCOR ) 100 MG tablet Take 1 tablet (100 mg total) by mouth 2 (two) times daily. 180 tablet 3 01/12/2024 Morning   ibuprofen  (ADVIL ) 200 MG tablet Take 400 mg by mouth every 6 (six) hours  as needed for headache or moderate pain.   01/10/2024   magnesium  oxide (MAG-OX) 400 (240 Mg) MG tablet TAKE 1 TABLET BY MOUTH DAILY. 90 tablet 2 01/12/2024 Morning   metoprolol  tartrate (LOPRESSOR ) 50 MG tablet Take 1 tablet (50 mg total) by mouth 2 (two) times daily. 180 tablet 3 01/12/2024 Morning   omeprazole (PRILOSEC) 40 MG capsule Take 40 mg by mouth as needed (for heartburn).   Unknown    Inpatient Medications:   atorvastatin   20 mg Oral Daily   enoxaparin  (LOVENOX ) injection  40 mg Subcutaneous Q24H   flecainide   100 mg Oral BID   fluticasone  furoate-vilanterol  1 puff  Inhalation Daily   magnesium  oxide  400 mg Oral Daily   metoprolol  tartrate  50 mg Oral BID   pantoprazole   40 mg Oral Daily   sodium chloride  flush  3 mL Intravenous Q12H    Allergies:  Allergies  Allergen Reactions   Tylenol  [Acetaminophen ] Nausea Only    stomahaches    Family History  Problem Relation Age of Onset   Heart disease Maternal Grandmother      Physical Exam: Vitals:   01/12/24 2029 01/12/24 2300 01/12/24 2317 01/13/24 0516  BP: (!) 126/58 (!) 107/58  95/65  Pulse: (!) 54 (!) 58 68 60  Resp: 18 16 20 15   Temp:  97.6 F (36.4 C)  97.9 F (36.6 C)  TempSrc:  Oral  Oral  SpO2: 99% 96% 97% 93%  Weight:      Height:        GEN- NAD, A&O x 3, normal affect HEENT: Normocephalic, atraumatic Lungs- CTAB, Normal effort.  Heart- Regular rate and rhythm, No M/G/R.  GI- Soft, NT, ND.  Extremities- No clubbing, cyanosis, or edema   Radiology/Studies: DG Chest 2 View Result Date: 01/11/2024 EXAM: 2 VIEW(S) XRAY OF THE CHEST 01/11/2024 07:08:00 PM COMPARISON: 11/19/22. CLINICAL HISTORY: Chest pain. Pt states chest pain since last Thursday. Pain is all over. FINDINGS: LUNGS AND PLEURA: Low lung volumes. No focal pulmonary opacity. No pulmonary edema. No pleural effusion. No pneumothorax. HEART AND MEDIASTINUM: No acute abnormality of the cardiac and mediastinal silhouettes. BONES AND SOFT TISSUES: Left chest wall pacemaker defibrillator in place. No acute osseous abnormality. IMPRESSION: 1. No acute cardiopulmonary pathology. Electronically signed by: Norman Gatlin MD 01/11/2024 07:11 PM EDT RP Workstation: [id number]   CUP PACEART INCLINIC DEVICE CHECK Result Date: 12/24/2023 Normal in-clinic dual chamber ICD check. Presenting Rhythm: ASVS . Routine testing was performed. Thresholds, sensing, and impedance demonstrate stable parameters and no programming changes needed. Episodes of NSVT, VT that was binned in VT-1 zone, pt had VT for at least 30 seconds before met detection  as was below detection zone.  VT zone reviewed with EP MD in clinic, VT-1 reduced to 153 bpm (VT was ~150-170 bpm).  ATP effective for all but one episode > in that episode, he slowed with ATP x3 but can not see the end of the arrhythmia on EGM. Estimated longevity 4.6-5 years. Pt enrolled in remote follow-up. bo   EKG:01/11/2024 - SR with frequent PAC, bigeminal at times, rare PVC; rate 69   (personally reviewed)  TELEMETRY: SB with occasional PVCs, very brief 2-3bt NSVT (personally reviewed)  DEVICE HISTORY: [employer] dual chamber ICD, imp 2020 Battery good, lead measurements stable  VT episode 8/25 with ATP x 2 > NSR No VT episodes since 8/25  Assessment/Plan: #) chest pain Unclear cause of patient's chest pain at this time Negative trops, stable  EKG Updated cMRI Has cPET scheduled in ~1 month, no indication to perform inpatient at this time  #) VT s/p ICD VT episode 8/25 with appropriate ATP x 2 No driving for 6 months   #) SVT On flecainide  for SVT suppression Stable intervals on EKG Continue 100mg  flec BID + 50mg  lopressor  BID    MD to see       For questions or updates, please contact [facility] Please consult [email or web address] for contact info under        For questions or updates, please contact [facility] Please consult [email or web address] for contact info under Cardiology/STEMI.  Signed, Suzann Riddle, NP  01/13/2024 11:59 AM  Patient seen and examined with Suzann Riddle.  He has had fairly constant 8/10 severity left-sided/central nonradiating chest discomfort unrelated to exertional activity since last Thursday evening.  He has history of sustained VT for which he had ICD implantation.  He is undergoing evaluation for possible sarcoidosis based off previous cardiac MRI.  He is pending PET scan for this.  He has no obvious risk factors for CAD.  His ECG on presentation was unremarkable for acute ischemia and he has troponins ruled out.  Inflammatory  markers only minimally elevated with CRP of 1.2 and ESR normal.  All other lab work unremarkable.  Discussed different options for further evaluation including cardiac MRI to evaluate for progression of mid myocardial delayed enhancement seen on 2020 imaging.  I do not think that his symptoms are suggestive of obstructive CAD although he has not had recent ischemic evaluation.  We did discuss coronary evaluation as a possibility although I think it is lower likelihood that this is the cause of his symptoms.  He did have mitral annular disjunction on prior 2020 echo and I considered whether this was the etiology of his prior VT or whether an underlying inflammatory condition would be more consistent.  Either way we are currently checking to see if we can proceed with cardiac MRI as an inpatient.  His device is MRI conditional.  If we are unable to get an MRI then we will get repeat echo to evaluate for baseline wall motion abnormalities.  I presume that either imaging study will be reassuring and he should be able to discharge home 09/11.  Donnice DELENA Primus, MD [facility]  Cardiac Electrophysiology

## 2024-01-13 NOTE — Progress Notes (Signed)
   01/13/24 1452  Spiritual Encounters  Type of Visit Initial  Care provided to: Patient  Referral source Clinical staff  Reason for visit Advance directives  OnCall Visit No  Spiritual Framework  Presenting Themes Impactful experiences and emotions  Community/Connection Family;Significant other  Patient Stress Factors Health changes  Family Stress Factors None identified  Interventions  Spiritual Care Interventions Made Compassionate presence;Established relationship of care and support  Intervention Outcomes  Outcomes Awareness of health;Awareness of support;Reduced anxiety  Advance Directives (For Healthcare)  Does Patient Have a Medical Advance Directive? No  Would patient like information on creating a medical advance directive? No - Patient declined   Chaplain met with Pt to discuss advance directive. Pt stated that he is preparing to get married soon and  stated that, at this time, he would designated his spouse to be the decision maker for any healthcare decision.   Chaplain reminded Pt chaplain services remain available upon request.

## 2024-01-14 ENCOUNTER — Observation Stay (HOSPITAL_BASED_OUTPATIENT_CLINIC_OR_DEPARTMENT_OTHER)

## 2024-01-14 DIAGNOSIS — I472 Ventricular tachycardia, unspecified: Secondary | ICD-10-CM

## 2024-01-14 DIAGNOSIS — R079 Chest pain, unspecified: Secondary | ICD-10-CM | POA: Diagnosis not present

## 2024-01-14 DIAGNOSIS — Z4502 Encounter for adjustment and management of automatic implantable cardiac defibrillator: Secondary | ICD-10-CM | POA: Diagnosis not present

## 2024-01-14 DIAGNOSIS — R0789 Other chest pain: Secondary | ICD-10-CM | POA: Diagnosis not present

## 2024-01-14 DIAGNOSIS — I10 Essential (primary) hypertension: Secondary | ICD-10-CM | POA: Diagnosis not present

## 2024-01-14 DIAGNOSIS — I4719 Other supraventricular tachycardia: Secondary | ICD-10-CM | POA: Diagnosis not present

## 2024-01-14 MED ORDER — GADOBUTROL 1 MMOL/ML IV SOLN
10.0000 mL | Freq: Once | INTRAVENOUS | Status: AC | PRN
  Administered 2024-01-14: 10 mL via INTRAVENOUS

## 2024-01-14 MED ORDER — IBUPROFEN 200 MG PO TABS: 400.0000 mg | ORAL_TABLET | Freq: Three times a day (TID) | ORAL | Status: DC | PRN

## 2024-01-14 MED ORDER — LORAZEPAM 1 MG PO TABS
1.0000 mg | ORAL_TABLET | Freq: Once | ORAL | Status: AC | PRN
  Administered 2024-01-14: 1 mg via ORAL
  Filled 2024-01-14: qty 1

## 2024-01-14 NOTE — CV Procedure (Signed)
  Device system confirmed to be MRI conditional, with implant date > 6 weeks ago, and no evidence of abandoned or epicardial leads in review of most recent CXR  Device last cleared by EP Provider: Suzann Riddle 01/14/24  Clearance is good through for 1 year as long as parameters remain stable at time of check. If pt undergoes a cardiac device procedure during that time, they should be re-cleared.   Tachy-therapies to be programmed off if applicable with device back to pre-MRI settings after completion of exam.  [employer] will be present for programming for the MRI.   Rocky Catalan, RT  01/14/2024 8:42 AM

## 2024-01-14 NOTE — Plan of Care (Signed)
  Problem: Health Behavior/Discharge Planning: Goal: Ability to manage health-related needs will improve Outcome: Adequate for Discharge   

## 2024-01-14 NOTE — Discharge Summary (Signed)
 ELECTROPHYSIOLOGY DISCHARGE SUMMARY    Patient ID: Xavier [name],  MRN: [id number], DOB/AGE: 05-Jan-1981 43 y.o.  Admit date: 01/11/2024 Discharge date: 01/14/2024  Primary Care Physician: Ilah Crigler, MD  Primary Cardiologist: None  Electrophysiologist: Dr. Waddell   Primary Discharge Diagnosis:  Chest pain  Secondary Discharge Diagnosis:  VT s/p ICD SVT HTN HLD  Procedures This Admission:  Cardiac MRI      Brief HPI: Xavier [name] is a 43 y.o. 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   Hospital Course:  The patient was admitted 01/11/2024 with ongoing complaints of chest pain since 9/4 and thought his ICD fired. Interrogation of ICD revealed last VT episode was 12/28/2023 converted with ATP x 2. During hospitalization, pain was unrelieved with tylenol , was relieved with PRN toradol . Updated cardiac MRI without significant change from prior.    They were monitored on telemetry overnight which demonstrated sinus rhythm with occasional PVC, brief NSVT.   The patient was examined and considered to be stable for discharge.  He has planned cardiac PET scheduled.  The patient will be seen back by Dr. Waddell after cPET.   Physical Exam: Vitals:   01/14/24 0624 01/14/24 0848 01/14/24 0908 01/14/24 1239  BP: 107/71 106/63  113/71  Pulse:  63 66 69  Resp: (!) 23 18  16   Temp: 98.5 F (36.9 C) 97.9 F (36.6 C)  98 F (36.7 C)  TempSrc: Oral Oral  Oral  SpO2: 95% 96%  97%  Weight:      Height:        GEN- NAD. A&O x 3.  HEENT: Normocephalic, atraumatic Lungs- CTAB, Normal effort.  Heart- RRR, No M/G/R.  GI- Soft, NT, ND.  Extremities- No clubbing, cyanosis, or edema  Discharge Medications:  Allergies as of 01/14/2024       Reactions   Tylenol  [acetaminophen ] Nausea Only   stomahaches        Medication List     TAKE these medications    Advair Diskus 100-50 MCG/DOSE  Aepb Generic drug: fluticasone -salmeterol Inhale 2 puffs into the lungs Twice daily.   albuterol  108 (90 Base) MCG/ACT inhaler Commonly known as: VENTOLIN  HFA Inhale 1-2 puffs into the lungs every 4 (four) hours as needed for wheezing or shortness of breath.   albuterol  (2.5 MG/3ML) 0.083% nebulizer solution Commonly known as: PROVENTIL  Take 3 mLs (2.5 mg total) by nebulization every 6 (six) hours as needed for wheezing or shortness of breath.   atorvastatin  20 MG tablet Commonly known as: LIPITOR Take 20 mg by mouth daily.   flecainide  100 MG tablet Commonly known as: TAMBOCOR  Take 1 tablet (100 mg total) by mouth 2 (two) times daily.   ibuprofen  200 MG tablet Commonly known as: ADVIL  Take 400 mg by mouth every 6 (six) hours as needed for headache or moderate pain.   magnesium  oxide 400 (240 Mg) MG tablet Commonly known as: MAG-OX TAKE 1 TABLET BY MOUTH DAILY.   metoprolol  tartrate 50 MG tablet Commonly known as: LOPRESSOR  Take 1 tablet (50 mg total) by mouth 2 (two) times daily.   omeprazole 40 MG capsule Commonly known as: PRILOSEC Take 40 mg by mouth as needed (for heartburn).        Disposition: Home with usual follow up as in AVS  Duration of Discharge Encounter:  APP time: 15 minutes  Signed, Keerat Denicola, NP  01/14/2024 6:37 PM

## 2024-01-14 NOTE — Progress Notes (Signed)
 Calls received from CCMD that patient has had episodes of non sustained VT while ambulating and at rest. Pt has been asymptomatic. Please refer to saved strip. Juleen CHRISTELLA Distel, BSN, RN

## 2024-01-14 NOTE — Plan of Care (Signed)
  Problem: Clinical Measurements: Goal: Diagnostic test results will improve Outcome: Progressing   Problem: Pain Managment: Goal: General experience of comfort will improve and/or be controlled Outcome: Progressing   Problem: Activity: Goal: Ability to tolerate increased activity will improve Outcome: Progressing   Problem: Cardiac: Goal: Ability to achieve and maintain adequate cardiovascular perfusion will improve Outcome: Progressing

## 2024-01-14 NOTE — CV Procedure (Signed)
  Device system confirmed to be MRI conditional, with implant date > 6 weeks ago, and no evidence of abandoned or epicardial leads in review of most recent CXR  Device last cleared by EP Provider: Suzann Riddle 01/14/24  Clearance is good through for 1 year as long as parameters remain stable at time of check. If pt undergoes a cardiac device procedure during that time, they should be re-cleared.   Tachy-therapies to be programmed off if applicable with device back to pre-MRI settings after completion of exam.  [employer] will be present for programming for the MRI.   Rocky Catalan, RT  01/14/2024 10:42 AM

## 2024-01-19 ENCOUNTER — Telehealth: Payer: Self-pay | Admitting: *Deleted

## 2024-01-19 NOTE — Telephone Encounter (Signed)
 Alert transmission received from [employer]:  Alert remote transmission: Successful ATP delivered 3 VT-1 events 9/15 @ 15:44, 19:27, and 20:22, 18-34sec in duration, HR's 161-171, V>A pace terminated with 1-3 bursts of ATP 2 NSVT, 8-10sec in duration, HR's 155-157, V>A Route to triage high alert per protocol Follow up as scheduled. [place], CVRS ______________________________________________________________________________  Called patient to assess for symptoms and notify of treatment received   Patient stated he felt dizzy around 3pm and sat down and his symptoms resolved and denied any symptoms for the other episodes  6 month driving restrictions per [place] reviewed with and verbally acknowledged by patient  Patient stated he is compliant with all prescribed medications   Patient has OV f/u on 10/22 with GT, MD   Will route to GT, MD for review and awareness since he is seeing patient next month

## 2024-01-20 ENCOUNTER — Telehealth: Payer: Self-pay | Admitting: Student in an Organized Health Care Education/Training Program

## 2024-01-20 DIAGNOSIS — Z0279 Encounter for issue of other medical certificate: Secondary | ICD-10-CM

## 2024-01-20 NOTE — Telephone Encounter (Signed)
 Pt dropped off [employer] (FMLA) paperwork, completed ROI + Billing forms, & paid $29 fee (cash).  Forms left in Dr. Garnette mailbox. Pt stated that Dr. Almetta or Dr. Waddell may complete paperwork. Pt made aware of 7-14 business day turnaround for completion.   JB, 01-20-24

## 2024-01-25 NOTE — Telephone Encounter (Signed)
 Completed FMLA form scanned to chart and faxed to [employer].  Billing notified.

## 2024-01-29 ENCOUNTER — Telehealth: Payer: Self-pay | Admitting: Internal Medicine

## 2024-01-29 DIAGNOSIS — Z0279 Encounter for issue of other medical certificate: Secondary | ICD-10-CM

## 2024-01-29 NOTE — Telephone Encounter (Signed)
 Patient brought in an Accommodation Request form from [employer].  He signed the Release of Information and paid the $29 form fee.   Form is in Dr. Adrian box.

## 2024-01-30 NOTE — Progress Notes (Signed)
 Remote ICD Transmission

## 2024-02-01 NOTE — Telephone Encounter (Signed)
 Alert transmission received from [employer]:  Alert remote transmission:  ATP Since 9/16: 1 VT-1 event on 9/25 at 1741 x 20 sec with V-rates 166 bpm treated with ATP x1.  V>A. To triage. 2 NSVT events, longest x 22 sec, V-rate 155-160 bpm.  V>A. Follow up as scheduled.. [place], CVRS ____________________________________________________________________________  Presenting as of 01/29/24 at 16:21: AS/VS 70s-80s. LMOVM requesting call back. DC number provided. Will discuss symptoms and driving restrictions.  Pt returned call. States he was sleeping at the time of the episodes on 9/25 (he works third shift). Denies symptoms at that time, but later in the night while working, he experienced some chest pain lasting up to a couple of hours. He states he was thinking about other things and didn't pay much attention to it. Denies other associated symptoms and states that he is feeling well at this time. Scheduled for a cardiac PET scan on 02/16/24 and follow-up with Dr. Waddell on 02/24/24. Pt verbalizes understanding of driving restrictions. Denies additional questions or concerns at this time.  Routed to Dr. Waddell for review and recommendations.

## 2024-02-01 NOTE — Telephone Encounter (Signed)
 No change. Continue current meds. Await PET.

## 2024-02-05 NOTE — Telephone Encounter (Signed)
 Paperwork has been being filled out by Dr Almetta

## 2024-02-08 ENCOUNTER — Emergency Department (HOSPITAL_COMMUNITY)
Admission: EM | Admit: 2024-02-08 | Discharge: 2024-02-08 | Disposition: A | Discharge status: Home / Self Care | Attending: Emergency Medicine | Admitting: Emergency Medicine

## 2024-02-08 ENCOUNTER — Emergency Department (HOSPITAL_COMMUNITY)

## 2024-02-08 ENCOUNTER — Other Ambulatory Visit: Payer: Self-pay

## 2024-02-08 ENCOUNTER — Telehealth: Payer: Self-pay

## 2024-02-08 ENCOUNTER — Other Ambulatory Visit (HOSPITAL_COMMUNITY): Payer: Self-pay

## 2024-02-08 ENCOUNTER — Encounter (HOSPITAL_COMMUNITY): Payer: Self-pay

## 2024-02-08 DIAGNOSIS — I428 Other cardiomyopathies: Secondary | ICD-10-CM | POA: Diagnosis not present

## 2024-02-08 DIAGNOSIS — R0602 Shortness of breath: Secondary | ICD-10-CM | POA: Diagnosis not present

## 2024-02-08 DIAGNOSIS — I472 Ventricular tachycardia, unspecified: Secondary | ICD-10-CM | POA: Diagnosis not present

## 2024-02-08 DIAGNOSIS — I517 Cardiomegaly: Secondary | ICD-10-CM | POA: Diagnosis not present

## 2024-02-08 DIAGNOSIS — R079 Chest pain, unspecified: Secondary | ICD-10-CM

## 2024-02-08 DIAGNOSIS — I471 Supraventricular tachycardia, unspecified: Secondary | ICD-10-CM | POA: Insufficient documentation

## 2024-02-08 DIAGNOSIS — Z95810 Presence of automatic (implantable) cardiac defibrillator: Secondary | ICD-10-CM | POA: Insufficient documentation

## 2024-02-08 DIAGNOSIS — R0789 Other chest pain: Secondary | ICD-10-CM | POA: Diagnosis not present

## 2024-02-08 LAB — TROPONIN I (HIGH SENSITIVITY)
Troponin I (High Sensitivity): 4 ng/L (ref <18)
Troponin I (High Sensitivity): 3 ng/L (ref <18)

## 2024-02-08 LAB — BASIC METABOLIC PANEL WITH GFR
Anion gap: 9 (ref 5–15)
BUN: 17 mg/dL (ref 6–20)
CO2: 21 mmol/L — ABNORMAL LOW (ref 22–32)
Calcium: 8.9 mg/dL (ref 8.9–10.3)
Chloride: 108 mmol/L (ref 98–111)
Creatinine, Ser: 1.32 mg/dL — ABNORMAL HIGH (ref 0.61–1.24)
GFR, Estimated: >60 mL/min (ref >60)
Glucose, Bld: 104 mg/dL — ABNORMAL HIGH (ref 70–99)
Potassium: 4.3 mmol/L (ref 3.5–5.1)
Sodium: 138 mmol/L (ref 135–145)

## 2024-02-08 LAB — CBC
HCT: 45.8 % (ref 39.0–52.0)
Hemoglobin: 14.6 g/dL (ref 13.0–17.0)
MCH: 30 pg (ref 26.0–34.0)
MCHC: 31.9 g/dL (ref 30.0–36.0)
MCV: 94.2 fL (ref 80.0–100.0)
Platelets: 130 K/uL — ABNORMAL LOW (ref 150–400)
RBC: 4.86 MIL/uL (ref 4.22–5.81)
RDW: 13.9 % (ref 11.5–15.5)
WBC: 4.2 K/uL (ref 4.0–10.5)
nRBC: 0 % (ref 0.0–0.2)

## 2024-02-08 MED ORDER — AMIODARONE HCL 200 MG PO TABS
ORAL_TABLET | ORAL | 0 refills | Status: DC
  Filled 2024-02-08: qty 44, 30d supply, fill #0

## 2024-02-08 MED ORDER — ASPIRIN 81 MG PO CHEW
324.0000 mg | CHEWABLE_TABLET | Freq: Once | ORAL | Status: AC
  Administered 2024-02-08: 324 mg via ORAL
  Filled 2024-02-08: qty 4

## 2024-02-08 MED ORDER — AMIODARONE HCL 200 MG PO TABS
ORAL_TABLET | ORAL | 0 refills | Status: DC
  Filled 2024-02-08: qty 74, 74d supply, fill #0

## 2024-02-08 NOTE — ED Notes (Signed)
 Cardiology at bedside.

## 2024-02-08 NOTE — Telephone Encounter (Signed)
 Called patient about his message. Patient stated he was already on his way to the ED at [facility]. Patient has someone driving him. Patient stated he has been having SOB and a sharp chest pain. Patient stated the ibuprofen  did not help with his chest pain. Informed patient that he was doing the right thing by going to the ED, and we would let the device team and his doctor know.

## 2024-02-08 NOTE — ED Notes (Signed)
 CCMD called.

## 2024-02-08 NOTE — ED Notes (Signed)
 Pt asking when he can leave. Pt advised Cardiology to evaluate him before leaving. Pt agrees at this time

## 2024-02-08 NOTE — ED Notes (Signed)
 Pt ambulated to restroom.

## 2024-02-08 NOTE — Telephone Encounter (Signed)
 Alert remote transmission:  VT/VF, successful ATP therapy Event occurred 10/4 @ 19:44, 22 sec in duration, HR 162, V>A, pace terminated with 1 burst of ATP - Route to triage PET pending  (Has appt with Dr. Waddell on 02/24/24).   Called to discuss with patient.  He has been feeling very poorly over weekend.  Reports chest pain that is not relieved with Ibuprofen  and was worried something was wrong.  Currently in the ER now to be evaluated.  He will share with care team that he has had more VT with ATP treatment.   Patient is aware of [place] DMV driving restrictions for 6months.

## 2024-02-08 NOTE — Discharge Instructions (Addendum)
 Evaluation today was reassuring.  After assessment here feel you are safe for discharge.  As you discussed with cardiology the plan is to stop your flecainide  and to add amiodarone.  Also submitted an ambulatory referral to cardiology.  Obviously if you have chest pain again, shortness of breath, palpitations or any other concerning symptom please return to the ED for further evaluation.

## 2024-02-08 NOTE — ED Triage Notes (Signed)
 Pt arrives by POV. Pt states chest pain began last night with stabbing left upper chest. 8/10. Shortness of breath stated at the same time as chest pain. No dizziness, sweating, fevers, Nausea or diarrhea. Vomited x1 on Saturday.

## 2024-02-08 NOTE — ED Provider Notes (Signed)
 [place] EM[facility] DEPARTMENT AT [facility] Provider Note   CSN: [id number] Arrival date & time: 02/08/24  9073     Patient presents with: Chest Pain and Shortness of Breath  HPI Xavier [name] is a 43 y.o. male with SVT, ICD in place, history of VT presenting for chest pain.  He states it started yesterday afternoon while sitting on the couch.  It is a left-sided pain that feels like stabbing radiating to the back.  Also reports intermittent shortness of breath but no diaphoresis.  Unsure what makes it worse.  He states the pain has been constant and is a 7 out of 10.    Chest Pain Associated symptoms: shortness of breath   Shortness of Breath Associated symptoms: chest pain        Prior to Admission medications   Medication Sig Start Date End Date Taking? Authorizing Provider  albuterol  (PROVENTIL ) (2.5 MG/3ML) 0.083% nebulizer solution Take 3 mLs (2.5 mg total) by nebulization every 6 (six) hours as needed for wheezing or shortness of breath. 11/20/22  Yes Elnor Jayson LABOR, DO  albuterol  (VENTOLIN  HFA) 108 (90 Base) MCG/ACT inhaler Inhale 1-2 puffs into the lungs every 4 (four) hours as needed for wheezing or shortness of breath. 11/20/22  Yes Elnor Jayson LABOR, DO  amiodarone (PACERONE) 200 MG tablet Take 1 tablet (200 mg total) by mouth daily for 14 days, THEN 1 tablet (200 mg total) daily. 02/08/24 04/22/24 Yes TilleryOzell Barter, PA-C  fluticasone -salmeterol (ADVAIR) 100-50 MCG/ACT AEPB Inhale 1 puff into the lungs 2 (two) times daily as needed (wheezing, shortness of breath).   Yes [provider]  ibuprofen  (ADVIL ) 200 MG tablet Take 400 mg by mouth 2 (two) times daily as needed for headache (pain).   Yes [provider]  metoprolol  tartrate (LOPRESSOR ) 50 MG tablet Take 1 tablet (50 mg total) by mouth 2 (two) times daily. 10/02/23  Yes Leverne Charlies Helling, PA-C  omeprazole (PRILOSEC) 40 MG capsule Take 40 mg by mouth daily. 05/30/23  Yes [provider]    Allergies: Tylenol  [acetaminophen ]    Review of Systems  Respiratory:  Positive for shortness of breath.   Cardiovascular:  Positive for chest pain.    Updated Vital Signs BP (!) 153/86   Pulse 64   Temp 97.9 F (36.6 C) (Oral)   Resp 18   Ht 5' 8 (1.727 m)   Wt 81.6 kg   SpO2 100%   BMI 27.37 kg/m   Physical Exam Vitals and nursing note reviewed.  HENT:     Head: Normocephalic and atraumatic.     Mouth/Throat:     Mouth: Mucous membranes are moist.  Eyes:     General:        Right eye: No discharge.        Left eye: No discharge.     Conjunctiva/sclera: Conjunctivae normal.  Cardiovascular:     Rate and Rhythm: Normal rate and regular rhythm.     Pulses: Normal pulses.     Heart sounds: Normal heart sounds.  Pulmonary:     Effort: Pulmonary effort is normal.     Breath sounds: Normal breath sounds.  Abdominal:     General: Abdomen is flat.     Palpations: Abdomen is soft.  Skin:    General: Skin is warm and dry.  Neurological:     General: No focal deficit present.  Psychiatric:        Mood and Affect: Mood normal.     (  all labs ordered are listed, but only abnormal results are displayed) Labs Reviewed  BASIC METABOLIC PANEL WITH GFR - Abnormal; Notable for the following components:      Result Value   CO2 21 (*)    Glucose, Bld 104 (*)    Creatinine, Ser 1.32 (*)    All other components within normal limits  CBC - Abnormal; Notable for the following components:   Platelets 130 (*)    All other components within normal limits  TROPONIN I (HIGH SENSITIVITY)  TROPONIN I (HIGH SENSITIVITY)    EKG: EKG Interpretation Date/Time:  Monday February 08 2024 09:40:11 EDT Ventricular Rate:  60 PR Interval:  306 QRS Duration:  136 QT Interval:  427 QTC Calculation: 427 R Axis:   -11  Text Interpretation: Sinus rhythm Prolonged PR interval Left atrial enlargement IVCD, consider atypical RBBB  ST/T changes similar to Sept 2025  Confirmed by Freddi Hamilton [phone]) on 02/08/2024 9:45:19 AM  Radiology: DG Chest 2 View Result Date: 02/08/2024 EXAM: PA AND LATERAL (2) VIEW(S) XRAY OF THE CHEST 02/08/2024 10:11:00 AM COMPARISON: Comparison chest radiographs 01/11/2024 and earlier. CLINICAL HISTORY: Chest pain, shortness of breath, stabbing left upper chest pain 8/10, vomiting x9. 43 year old male. FINDINGS: LINES, TUBES AND DEVICES: Stable left Chest AICD. LUNGS AND PLEURA: No focal pulmonary opacity. No pulmonary edema. No pleural effusion. No pneumothorax. HEART AND MEDIASTINUM: Mild cardiomegaly. BONES AND SOFT TISSUES: No acute osseous abnormality. IMPRESSION: 1. No acute cardiopulmonary abnormality. 2. Stable left chest AICD. 3. Mild cardiomegaly. Electronically signed by: Helayne Hurst MD 02/08/2024 10:35 AM EDT RP Workstation: [id number]     Procedures   Medications Ordered in the ED  aspirin chewable tablet 324 mg (324 mg Oral Given 02/08/24 1101)                                    Medical Decision Making Amount and/or Complexity of Data Reviewed Labs: ordered. Radiology: ordered.  Risk OTC drugs.   43 year old well-appearing male presenting for chest pain.  Exam was unremarkable.  However he did state that his chest pain was still about the same as when it started the afternoon prior.  Workup thus far is reassuring.  Troponins are negative and flat.  EKG showed sinus rhythm.  Chest x-ray was nonacute.  Did give him aspirin given the persistence of his chest pain.  Reached out to cardiology who was able to evaluate him in person.  After their evaluation they felt that he was safe for discharge.  They were going to stop his flecainide  and started him on amiodarone.  Those medications were sent to [facility] pharmacy.  Placed cardio follow-up.  Discussed return precautions.  Discharged.     Final diagnoses:  Chest pain, unspecified type    ED Discharge Orders          Ordered    amiodarone (PACERONE) 200 MG  tablet  Daily        02/08/24 1438    Ambulatory referral to Cardiology       Comments: If you have not heard from the Cardiology office within the next 72 hours please call [phone].   02/08/24 1442               Lang Norleen POUR, PA-C 02/08/24 1446    Freddi Hamilton, MD 02/11/24 [phone]

## 2024-02-08 NOTE — ED Notes (Signed)
 PT states heart center just called him and said he was in vtach on Saturday and they were going to update the chart. EDPA advised

## 2024-02-08 NOTE — ED Notes (Signed)
 Pt verbalized understanding of discharge instructions. Pt verbalized he is going to the pharmacy right now to pick up his medications.

## 2024-02-08 NOTE — Consult Note (Signed)
 ELECTROPHYSIOLOGY CONSULT NOTE    Patient ID: Xavier [name] MRN: [id number], DOB/AGE: 09/26/1980 43 y.o.  Admit date: 02/08/2024 Date of Consult: 02/08/2024  Primary Physician: Ilah Crigler, MD Primary Cardiologist: None  Electrophysiologist: Dr. Waddell   Referring Provider: Dr. Freddi  Patient Profile: Xavier [name] is a 43 y.o. male with a history of infiltrative cardiomyopathy (suspected sarcoid), VT s/p ICD, SVT, HTN, HLD who is being seen today for the evaluation of VT at the request of Dr. Freddi.  HPI:  Xavier [name] is a 43 y.o. male with complicated medical h/o as above.   ICD implanted 2020 with history of appropriate ATP therapy. He is on flecainide  since 2010.  He was seen by NP Aniceto 8/21 for VT with ATP x 1, then SVT, symptomatic. Labs stable at that appt. VT zone adjusted, planning for cPET to confirm sarcoid diagnosis.    He presented to [facility] ER 9/8 with c/o chest pain, SOB, HV therapy, and transferred to [facility] for further evaluation. Work up unremarkable. Kept on Flecainide  and Lopressor .   Today, alert received for appropriate therapy 10/4, called to assess symptoms and pt was in ED already to be evaluated for chest discomfort despite ibuprofen .   Work up unremarkable. HS trop flat. And other labs as below. No further VT on ICD check since 10/4.  Pt seen and discussed with Dr. Cindie. With recurrent VT, will switch to amiodarone for now -> further AAD planning to follow PET scan.   Labs Potassium4.3 (10/06 9052)   Creatinine, ser  1.32* (10/06 0947) PLT  130* (10/06 0947) HGB  14.6 (10/06 0947) WBC 4.2 (10/06 0947) Troponin I (High Sensitivity)3 (10/06 1200).    Past Medical History:  Diagnosis Date   SVT (supraventricular tachycardia)    EPS by Dr Waddell 2007 demonstrated AP near the AV node for which ablation was deferred     Surgical History:  Past Surgical History:  Procedure Laterality Date   ELECTROPHYSIOLOGIC STUDY  2007   EP study by Dr  Waddell revealed an apparent unusual accessory pathway for which ablation was deferred due to close proximity to the AV node   ICD IMPLANT N/A 12/07/2018   Procedure: ICD IMPLANT;  Surgeon: Waddell Danelle ORN, MD;  Location: [facility] INVASIVE CV LAB;  Service: Cardiovascular;  Laterality: N/A;   LEG SURGERY     broken leg   STOMACH SURGERY     SVT ABLATION N/A 12/06/2018   Procedure: SVT ABLATION;  Surgeon: Waddell Danelle ORN, MD;  Location: [facility] INVASIVE CV LAB;  Service: Cardiovascular;  Laterality: N/A;     (Not in a hospital admission)   Inpatient Medications:   Allergies:  Allergies  Allergen Reactions   Tylenol  [Acetaminophen ] Nausea Only and Other (See Comments)    Stomach pain    Family History  Problem Relation Age of Onset   Heart disease Maternal Grandmother      Physical Exam: Vitals:   02/08/24 1230 02/08/24 1300 02/08/24 1330 02/08/24 1430  BP: 128/77 129/76 101/78 (!) 153/86  Pulse: 62 64 (!) 57 64  Resp: (!) 22 (!) 26 16 18   Temp:   97.9 F (36.6 C)   TempSrc:   Oral   SpO2: 100% 100% 100% 100%  Weight:      Height:        GEN- NAD, A&O x 3, normal affect HEENT: Normocephalic, atraumatic Lungs- CTAB, Normal effort.  Heart- Regular rate and rhythm, No M/G/R.  GI- Soft, NT, ND.  Extremities- No  clubbing, cyanosis, or edema   Radiology/Studies: DG Chest 2 View Result Date: 02/08/2024 EXAM: PA AND LATERAL (2) VIEW(S) XRAY OF THE CHEST 02/08/2024 10:11:00 AM COMPARISON: Comparison chest radiographs 01/11/2024 and earlier. CLINICAL HISTORY: Chest pain, shortness of breath, stabbing left upper chest pain 8/10, vomiting x47. 43 year old male. FINDINGS: LINES, TUBES AND DEVICES: Stable left Chest AICD. LUNGS AND PLEURA: No focal pulmonary opacity. No pulmonary edema. No pleural effusion. No pneumothorax. HEART AND MEDIASTINUM: Mild cardiomegaly. BONES AND SOFT TISSUES: No acute osseous abnormality. IMPRESSION: 1. No acute cardiopulmonary abnormality. 2. Stable left chest AICD. 3.  Mild cardiomegaly. Electronically signed by: Helayne Hurst MD 02/08/2024 10:35 AM EDT RP Workstation: [id number]   MR CARDIAC MORPHOLOGY W WO CONTRAST Result Date: 01/14/2024 CLINICAL DATA:  VT, infiltrative disease EXAM: CARDIAC MRI TECHNIQUE: The patient was scanned on a 1.5 Tesla GE magnet. A dedicated cardiac coil was used. Functional imaging was done using Fiesta sequences. 2,3, and 4 chamber views were done to assess for RWMA's. Modified Simpson's rule using a short axis stack was used to calculate an ejection fraction on a dedicated work [employer]. The patient received 10 cc of Gadavist . After 10 minutes inversion recovery sequences were used to assess for infiltration and scar tissue. FINDINGS: Limited images of the lung fields show no gross abnormalities. There is a pacemaker in the right heart with resultant artifact. Despite efforts to correct, artifact is extensive. Normal left ventricular size with mild concentric LV hypertrophy. Mild septal hypokinesis with LV EF 50%. RV poorly visualized due to pacemaker artifact. Grossly, the RV appears mildly dilated with normal systolic function. Normal left and right atrial sizes. Trileaflet aortic valve with no stenosis or regurgitation noted. Unable to comment on mitral regurgitation. Flow sequences not done. On delayed enhancement imaging, there was no definite late gadolinium enhancement (difficult images due to pacemaker artifact but ultimately had interpretable images). MEASUREMENTS: MEASUREMENTS LVEDV 152 mL LVEDVi 77 mL/m2 LVSV 76 mL LVEF 50% No flow sequences. Due to artifact, T1 and T2 sequences not interpretable. IMPRESSION: 1.  Technically very difficult study due to pacemaker artifact. 2. Normal LV size with mild LV hypertrophy. EF 50% wtih septal hypokinesis. 3. RV poorly visualized due to ICD artifact. Probably mildly dilated with normal systolic function. 4. Ultimately we were able to get interpretable delayed enhancement  images. There was no definite late gadolinium enhancement noted, so no definitive evidence for prior MI, infiltrative disease, or myocarditis. 5. No flow sequences and T1/T2 sequences not interpretable due to artifact. Dalton Mclean Electronically Signed   By: Ezra Shuck M.D.   On: 01/14/2024 14:43   DG Chest 2 View Result Date: 01/11/2024 EXAM: 2 VIEW(S) XRAY OF THE CHEST 01/11/2024 07:08:00 PM COMPARISON: 11/19/22. CLINICAL HISTORY: Chest pain. Pt states chest pain since last Thursday. Pain is all over. FINDINGS: LUNGS AND PLEURA: Low lung volumes. No focal pulmonary opacity. No pulmonary edema. No pleural effusion. No pneumothorax. HEART AND MEDIASTINUM: No acute abnormality of the cardiac and mediastinal silhouettes. BONES AND SOFT TISSUES: Left chest wall pacemaker defibrillator in place. No acute osseous abnormality. IMPRESSION: 1. No acute cardiopulmonary pathology. Electronically signed by: Norman Gatlin MD 01/11/2024 07:11 PM EDT RP Workstation: [id number]    EKG: NSR at  60 bpm (personally reviewed)  TELEMETRY: SB/NSR 50-70s (personally reviewed)  DEVICE HISTORY:  St. Jude dual chamber ICD, imp 2020 Battery good, lead measurements stable  Assessment/Plan:  VT s/p ICD VT episode 10/4 with appropriate therapy With continued VT will  stop flecainide  for now Start amiodarone 200 mg BID x 2 weeks, then 200 mg daily.  Stressed importance of PET scan 10/14. If + for sarcoid, treatment can hopefully result in ability to come off amio in the future  Atypical chest pain HS trop flat.  Stable EKG Recent cMRI technically difficult, but not felt to have LGE.   Dr. Cindie has seen. OK for home with close follow up which is already arranged.   For questions or updates, please contact [facility] Please consult [email or web address] for contact info under     Signed, Ozell Prentice Passey, PA-C  02/08/2024, 2:35 PM

## 2024-02-15 ENCOUNTER — Telehealth: Payer: Self-pay | Admitting: Pulmonary Disease

## 2024-02-15 DIAGNOSIS — I472 Ventricular tachycardia, unspecified: Secondary | ICD-10-CM

## 2024-02-15 NOTE — Telephone Encounter (Signed)
 Contacted by Radiology, need to change current order to NM PET cardiac sarcoidosis.    New order placed.  cMRI non-diagnostic.     Daphne Barrack, NP-C, AGACNP-BC [facility] - Electrophysiology  02/15/2024, 1:15 PM

## 2024-02-16 ENCOUNTER — Other Ambulatory Visit (HOSPITAL_COMMUNITY)

## 2024-02-18 DIAGNOSIS — J4530 Mild persistent asthma, uncomplicated: Secondary | ICD-10-CM | POA: Diagnosis not present

## 2024-02-18 DIAGNOSIS — R531 Weakness: Secondary | ICD-10-CM | POA: Diagnosis not present

## 2024-02-18 DIAGNOSIS — F419 Anxiety disorder, unspecified: Secondary | ICD-10-CM | POA: Diagnosis not present

## 2024-02-18 DIAGNOSIS — I10 Essential (primary) hypertension: Secondary | ICD-10-CM | POA: Diagnosis not present

## 2024-02-22 ENCOUNTER — Encounter (HOSPITAL_COMMUNITY): Payer: Self-pay | Admitting: Emergency Medicine

## 2024-02-23 ENCOUNTER — Encounter (HOSPITAL_COMMUNITY): Payer: Self-pay

## 2024-02-23 ENCOUNTER — Telehealth (HOSPITAL_COMMUNITY): Payer: Self-pay | Admitting: *Deleted

## 2024-02-23 NOTE — Telephone Encounter (Signed)
 Reaching out to patient to offer assistance regarding upcoming cardiac imaging study; pt verbalizes understanding of appt date/time, parking situation and where to check in, pre-test NPO status and medications ordered, and verified current allergies; name and call back number provided for further questions should they arise Johney Frame RN Navigator Cardiac Imaging [facility] [phone] office [phone] cell   Patient verbalized understanding of diet prep.

## 2024-02-24 ENCOUNTER — Encounter: Payer: Self-pay | Admitting: Internal Medicine

## 2024-02-24 ENCOUNTER — Ambulatory Visit: Attending: Cardiology | Admitting: Internal Medicine

## 2024-02-24 VITALS — BP 145/78 | HR 58 | Ht 68.0 in | Wt 185.0 lb

## 2024-02-24 DIAGNOSIS — I472 Ventricular tachycardia, unspecified: Secondary | ICD-10-CM

## 2024-02-24 MED ORDER — AMIODARONE HCL 200 MG PO TABS: 200.0000 mg | ORAL_TABLET | Freq: Two times a day (BID) | ORAL | 3 refills | Status: AC

## 2024-02-24 NOTE — Patient Instructions (Addendum)
 Medication Instructions:  Your physician has recommended you make the following change in your medication:  Take amiodarone 200 mg twice daily until Dec 1st. Take once a day after.  Lab Work: None ordered.  You may go to any [facility] Location for your lab work:  [facility] - [address] ([facility]) - [address] [phone] [address]   - [address]   [place]  - [address]   [place] - [address] - [address] [employer]  - [address] ([facility]   If you have labs (blood work) drawn today and your tests are completely normal, you will receive your results only by: [employer] (if you have MyChart)  If you have any lab test that is abnormal or we need to change your treatment, we will call you or send a MyChart message to review the results.  Testing/Procedures: None ordered.  Follow-Up: At [facility], you and your health needs are our priority.  As part of our continuing mission to provide you with exceptional heart care, we have created designated Provider Care Teams.  These Care Teams include your primary Cardiologist (physician) and Advanced Practice Providers (APPs -  Physician Assistants and Nurse Practitioners) who all work together to provide you with the care you need, when you need it.  We recommend signing up for the patient portal called MyChart.  Sign up information is provided on this After Visit Summary.  MyChart is used to connect with patients for Virtual Visits (Telemedicine).  Patients are able to view lab/test results, encounter notes, upcoming appointments, etc.  Non-urgent messages can be sent to your provider as well.   To learn more about what you can do with MyChart, go to [email or web address].    Your next appointment:   April 2026  The format for your next appointment:   In  Person  Provider:   Donnice Primus, MD or one of the following Advanced Practice Providers on your designated Care Team:   Charlies Arthur, [place] Ozell Jodie Passey, [place] Leotis Barrack, NP  Note: Remote monitoring is used to monitor your Pacemaker/ ICD from home. This monitoring reduces the number of office visits required to check your device to one time per year. It allows us  to keep an eye on the functioning of your device to ensure it is working properly.

## 2024-02-24 NOTE — Progress Notes (Signed)
 HPI Xavier [name] returns today for followup. He has a h/o narrow and wide QRS tachy with VA dissociation and septal scar who has had recurrent VT and multiple ICD therapies. He had been on flecainide  but this became ineffective and he was placed on amiodarone over a month ago. He has been stable. He had a repeat CMR which was difficult to interpret, EF 50%. He is pending a cardiac PET scan tomorrow.  Allergies  Allergen Reactions   Tylenol  [Acetaminophen ] Nausea Only and Other (See Comments)    Stomach pain     Current Outpatient Medications  Medication Sig Dispense Refill   albuterol  (PROVENTIL ) (2.5 MG/3ML) 0.083% nebulizer solution Take 3 mLs (2.5 mg total) by nebulization every 6 (six) hours as needed for wheezing or shortness of breath. 75 mL 12   albuterol  (VENTOLIN  HFA) 108 (90 Base) MCG/ACT inhaler Inhale 1-2 puffs into the lungs every 4 (four) hours as needed for wheezing or shortness of breath. 6.7 each 1   amiodarone (PACERONE) 200 MG tablet Take 1 tablet (200 mg total) by mouth 2 (two) times daily. Until Dec 1st. Then take 1 tablet by mouth daily. 180 tablet 3   ibuprofen  (ADVIL ) 200 MG tablet Take 400 mg by mouth 2 (two) times daily as needed for headache (pain).     metoprolol  tartrate (LOPRESSOR ) 50 MG tablet Take 1 tablet (50 mg total) by mouth 2 (two) times daily. 180 tablet 3   omeprazole (PRILOSEC) 40 MG capsule Take 40 mg by mouth daily.     fluticasone -salmeterol (ADVAIR) 100-50 MCG/ACT AEPB Inhale 1 puff into the lungs 2 (two) times daily as needed (wheezing, shortness of breath). (Patient not taking: Reported on 02/24/2024)     No current facility-administered medications for this visit.     Past Medical History:  Diagnosis Date   SVT (supraventricular tachycardia)    EPS by Dr Waddell 2007 demonstrated AP near the AV node for which ablation was deferred    ROS:   All systems reviewed and negative except as noted in the HPI.   Past Surgical History:   Procedure Laterality Date   ELECTROPHYSIOLOGIC STUDY  2007   EP study by Dr Waddell revealed an apparent unusual accessory pathway for which ablation was deferred due to close proximity to the AV node   ICD IMPLANT N/A 12/07/2018   Procedure: ICD IMPLANT;  Surgeon: Waddell Danelle ORN, MD;  Location: [facility] INVASIVE CV LAB;  Service: Cardiovascular;  Laterality: N/A;   LEG SURGERY     broken leg   STOMACH SURGERY     SVT ABLATION N/A 12/06/2018   Procedure: SVT ABLATION;  Surgeon: Waddell Danelle ORN, MD;  Location: [facility] INVASIVE CV LAB;  Service: Cardiovascular;  Laterality: N/A;     Family History  Problem Relation Age of Onset   Heart disease Maternal Grandmother      Social History   Socioeconomic History   Marital status: Single    Spouse name: Not on file   Number of children: 3   Years of education: Not on file   Highest education level: Not on file  Occupational History   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  Tobacco Use   Smoking status: Never   Smokeless tobacco: Never  Vaping Use   Vaping status: Never Used  Substance and Sexual Activity   Alcohol use: No   Drug use: No   Sexual activity: Not on file  Other Topics Concern   Not on file  Social History Narrative   Not on file   Social Drivers of Health   Financial Resource Strain: Not on file  Food Insecurity: No Food Insecurity (01/12/2024)   Hunger Vital Sign    Worried About Running Out of Food in the Last Year: Never true    Ran Out of Food in the Last Year: Never true  Transportation Needs: No Transportation Needs (01/12/2024)   PRAPARE - Administrator, Civil Service (Medical): No    Lack of Transportation (Non-Medical): No  Physical Activity: Not on file  Stress: Not on file  Social Connections: Not on file  Intimate Partner Violence: Not At Risk (01/12/2024)   Humiliation, Afraid, Rape, and Kick questionnaire    Fear of Current or Ex-Partner: No    Emotionally Abused: No    Physically Abused: No     Sexually Abused: No     BP (!) 145/78   Pulse (!) 58   Ht 5' 8 (1.727 m)   Wt 185 lb (83.9 kg)   SpO2 98%   BMI 28.13 kg/m   Physical Exam:  Well appearing NAD HEENT: Unremarkable Neck:  No JVD, no thyromegally Lymphatics:  No adenopathy Back:  No CVA tenderness Lungs:  Clear HEART:  Regular rate rhythm, no murmurs, no rubs, no clicks Abd:  soft, positive bowel sounds, no organomegally, no rebound, no guarding Ext:  2 plus pulses, no edema, no cyanosis, no clubbing Skin:  No rashes no nodules Neuro:  CN II through XII intact, motor grossly intact   DEVICE  Normal device function.  See PaceArt for details.   Assess/Plan: VT - he has not had any more VT on amiodarone. He will continue his current meds. Possible sarcoid - he is pending cardiac pet scan.  HTN - his bp is controlled and we will follow.  Danelle Kileen Lange,MD

## 2024-02-25 ENCOUNTER — Ambulatory Visit (HOSPITAL_COMMUNITY)
Admission: RE | Admit: 2024-02-25 | Discharge: 2024-02-25 | Disposition: A | Discharge status: Home / Self Care | Source: Ambulatory Visit | Attending: Pulmonary Disease | Admitting: Pulmonary Disease

## 2024-02-25 DIAGNOSIS — I472 Ventricular tachycardia, unspecified: Secondary | ICD-10-CM | POA: Diagnosis not present

## 2024-02-25 MED ORDER — RUBIDIUM RB82 GENERATOR (RUBYFILL)
21.8300 | PACK | Freq: Once | INTRAVENOUS | Status: AC
  Administered 2024-02-25: 21.83 via INTRAVENOUS

## 2024-02-25 MED ORDER — FLUDEOXYGLUCOSE F - 18 (FDG) INJECTION
9.0000 | Freq: Once | INTRAVENOUS | Status: AC
  Administered 2024-02-25: 8.92 via INTRAVENOUS

## 2024-02-29 LAB — NM PET CT MYOCARDIAL SARCOIDOSIS
LV dias vol: 139 mL (ref 62–150)
Nuc Stress EF: 44 %
Rest Nuclear Isotope Dose: 21.8 mCi

## 2024-03-02 NOTE — Telephone Encounter (Signed)
 Accommodation form faxed to [employer] and scanned into chart. Billing notified.

## 2024-03-03 ENCOUNTER — Encounter (HOSPITAL_COMMUNITY)

## 2024-03-03 ENCOUNTER — Ambulatory Visit: Payer: Self-pay | Admitting: Pulmonary Disease

## 2024-03-04 ENCOUNTER — Telehealth: Payer: Self-pay | Admitting: Student in an Organized Health Care Education/Training Program

## 2024-03-04 NOTE — Telephone Encounter (Signed)
 Patient is returning call.

## 2024-03-04 NOTE — Telephone Encounter (Signed)
 FMLA payment posted to pt's chart.  JB, 03-04-24

## 2024-03-10 ENCOUNTER — Ambulatory Visit (INDEPENDENT_AMBULATORY_CARE_PROVIDER_SITE_OTHER): Payer: BC Managed Care – PPO

## 2024-03-10 DIAGNOSIS — I472 Ventricular tachycardia, unspecified: Secondary | ICD-10-CM

## 2024-03-10 LAB — CUP PACEART REMOTE DEVICE CHECK
Battery Remaining Longevity: 56 mo
Battery Remaining Percentage: 54 %
Battery Voltage: 2.95 V
Brady Statistic AP VP Percent: 1 %
Brady Statistic AP VS Percent: 1 %
Brady Statistic AS VP Percent: 1 %
Brady Statistic AS VS Percent: 99 %
Brady Statistic RA Percent Paced: 1 %
Brady Statistic RV Percent Paced: 1 %
Date Time Interrogation Session: 20251106030017
HighPow Impedance: 69 Ohm
HighPow Impedance: 69 Ohm
Implantable Lead Connection Status: 753985
Implantable Lead Connection Status: 753985
Implantable Lead Implant Date: 20200804
Implantable Lead Implant Date: 20200804
Implantable Lead Location: 753859
Implantable Lead Location: 753860
Implantable Lead Model: 7122
Implantable Pulse Generator Implant Date: 20200804
Lead Channel Impedance Value: 510 Ohm
Lead Channel Impedance Value: 580 Ohm
Lead Channel Pacing Threshold Amplitude: 0.75 V
Lead Channel Pacing Threshold Amplitude: 0.75 V
Lead Channel Pacing Threshold Pulse Width: 0.5 ms
Lead Channel Pacing Threshold Pulse Width: 0.5 ms
Lead Channel Sensing Intrinsic Amplitude: 12 mV
Lead Channel Sensing Intrinsic Amplitude: 5 mV
Lead Channel Setting Pacing Amplitude: 2 V
Lead Channel Setting Pacing Amplitude: 2.5 V
Lead Channel Setting Pacing Pulse Width: 0.5 ms
Lead Channel Setting Sensing Sensitivity: 0.5 mV
Pulse Gen Serial Number: 9903314

## 2024-03-11 ENCOUNTER — Ambulatory Visit: Payer: Self-pay | Admitting: Internal Medicine

## 2024-03-14 NOTE — Progress Notes (Signed)
 Remote ICD Transmission

## 2024-03-25 ENCOUNTER — Ambulatory Visit: Attending: Student | Admitting: Student

## 2024-03-25 ENCOUNTER — Encounter: Payer: Self-pay | Admitting: Student

## 2024-03-25 VITALS — BP 126/77 | HR 61 | Ht 68.0 in | Wt 199.0 lb

## 2024-03-25 DIAGNOSIS — Z9189 Other specified personal risk factors, not elsewhere classified: Secondary | ICD-10-CM

## 2024-03-25 DIAGNOSIS — I472 Ventricular tachycardia, unspecified: Secondary | ICD-10-CM | POA: Diagnosis not present

## 2024-03-25 DIAGNOSIS — Z95810 Presence of automatic (implantable) cardiac defibrillator: Secondary | ICD-10-CM | POA: Diagnosis not present

## 2024-03-25 DIAGNOSIS — Z79899 Other long term (current) drug therapy: Secondary | ICD-10-CM | POA: Diagnosis not present

## 2024-03-25 NOTE — Patient Instructions (Signed)
 Medication Instructions:  No medication changes today. *If you need a refill on your cardiac medications before your next appointment, please call your pharmacy*  Lab Work: CMET, TSH, and Free T4 today If you have labs (blood work) drawn today and your tests are completely normal, you will receive your results only by: MyChart Message (if you have MyChart) OR A paper copy in the mail If you have any lab test that is abnormal or we need to change your treatment, we will call you to review the results.  Testing/Procedures: No testing ordered today  Follow-Up: At [facility], you and your health needs are our priority.  As part of our continuing mission to provide you with exceptional heart care, our providers are all part of one team.  This team includes your primary Cardiologist (physician) and Advanced Practice Providers or APPs (Physician Assistants and Nurse Practitioners) who all work together to provide you with the care you need, when you need it.  Your next appointment:   In April with Dr. Almetta  Provider:   You may see Dr. Adina Almetta or one of the following Advanced Practice Providers on your designated Care Team:   Charlies Arthur, PA-C Nanetta Wiegman Andy Erynne Kealey, PA-C Suzann Riddle, NP Daphne Barrack, NP    We recommend signing up for the patient portal called MyChart.  Sign up information is provided on this After Visit Summary.  MyChart is used to connect with patients for Virtual Visits (Telemedicine).  Patients are able to view lab/test results, encounter notes, upcoming appointments, etc.  Non-urgent messages can be sent to your provider as well.   To learn more about what you can do with MyChart, go to [email or web address].

## 2024-03-25 NOTE — Progress Notes (Signed)
  Electrophysiology Office Note:   ID:  Xavier [name], DOB 07/03/1980, MRN [id number]  Primary Cardiologist: None Electrophysiologist: Danelle Birmingham, MD      History of Present Illness:   Xavier [name] is a 43 y.o. male with h/o infiltrative cardiomyopathy (suspected sarcoid), VT s/p ICD, SVT, HTN, HLD seen today for routine electrophysiology followup.   Since last being seen in our clinic the patient reports doing very well. Mild, chest soreness some mornings. Feels like it is related to what position he slept. Denies exertional chest pain or SOB. No syncope or persistent edema. Sometimes has mild ankle edema at the end of the day. No syncope or ICD shocks.   Review of systems complete and found to be negative unless listed in HPI.   EP Information / Studies Reviewed:    EKG is ordered today. Personal review as below.  EKG Interpretation Date/Time:  Friday March 25 2024 11:09:27 EST Ventricular Rate:  61 PR Interval:  268 QRS Duration:  114 QT Interval:  442 QTC Calculation: 444 R Axis:   34  Text Interpretation: Sinus rhythm with 1st degree A-V block Incomplete right bundle branch block Confirmed by Lesia Sharper [phone]) on 03/25/2024 11:17:04 AM    ICD Interrogation-  Most recent remote reviewed. Brief check only. No testing. No new episodes.   Arrhythmia/Device History Abbott Dual Chamber ICD implanted 12/07/2018 for VT History of appropriate therapy: Yes > 03/2022, 11/2022 (COVID), 03/2023 VT under detection zone   cMRI 12/2018 with concerns for possible granulomatous process such as sarcoid  cMRI 01/2024 technically difficult, and no interpretable delayed enhancement images.   PET 02/29/2024 with NO FDG uptake observed.   Physical Exam:   VS:  BP 126/77   Pulse 61   Ht 5' 8 (1.727 m)   Wt 199 lb (90.3 kg)   SpO2 97%   BMI 30.26 kg/m    Wt Readings from Last 3 Encounters:  03/25/24 199 lb (90.3 kg)  02/24/24 185 lb (83.9 kg)  02/08/24 180 lb (81.6 kg)      GEN: No acute distress  NECK: No JVD; No carotid bruits CARDIAC: Regular rate and rhythm, no murmurs, rubs, gallops RESPIRATORY:  Clear to auscultation without rales, wheezing or rhonchi  ABDOMEN: Soft, non-tender, non-distended EXTREMITIES:  No edema; No deformity   ASSESSMENT AND PLAN:    Chronic Systolic Dysfunction s/p Abbott dual chamber ICD  Infiltrative Cardiomyopathy / Suspected Sarcoidosis (by MRI) VT / NSVT  cMRI 01/2024  EF 50% but technically difficult study, no interpretable LGE data.  euvolemic today Stable on an appropriate medical regimen Normal ICD function by most recent remote.  Brief check only today with no testing; No episodes.  Continue amiodarone  200 mg BID until 12/1, then decrease to 200 mg daily.  Surveillance labs today.   Disposition:   Follow up with Dr. Almetta in April to assume care from Dr. Birmingham.    Signed, Sharper Prentice Lesia, PA-C

## 2024-03-26 LAB — COMPREHENSIVE METABOLIC PANEL WITH GFR
ALT: 61 IU/L — ABNORMAL HIGH (ref 0–44)
AST: 33 IU/L (ref 0–40)
Albumin: 4.5 g/dL (ref 4.1–5.1)
Alkaline Phosphatase: 48 IU/L (ref 47–123)
BUN/Creatinine Ratio: 9 (ref 9–20)
BUN: 12 mg/dL (ref 6–24)
Bilirubin Total: 0.7 mg/dL (ref 0.0–1.2)
CO2: 21 mmol/L (ref 20–29)
Calcium: 9.4 mg/dL (ref 8.7–10.2)
Chloride: 106 mmol/L (ref 96–106)
Creatinine, Ser: 1.35 mg/dL — ABNORMAL HIGH (ref 0.76–1.27)
Globulin, Total: 2.9 g/dL (ref 1.5–4.5)
Glucose: 95 mg/dL (ref 70–99)
Potassium: 4.5 mmol/L (ref 3.5–5.2)
Sodium: 142 mmol/L (ref 134–144)
Total Protein: 7.4 g/dL (ref 6.0–8.5)
eGFR: 66 mL/min/1.73

## 2024-03-26 LAB — TSH: TSH: 2.49 u[IU]/mL (ref 0.450–4.500)

## 2024-03-26 LAB — T4, FREE: Free T4: 1.72 ng/dL (ref 0.82–1.77)

## 2024-03-27 ENCOUNTER — Ambulatory Visit: Payer: Self-pay | Admitting: Student

## 2024-05-19 ENCOUNTER — Ambulatory Visit (HOSPITAL_COMMUNITY): Payer: Self-pay | Admitting: Cardiology

## 2024-06-09 ENCOUNTER — Ambulatory Visit

## 2024-06-10 LAB — CUP PACEART REMOTE DEVICE CHECK
Battery Remaining Longevity: 53 mo
Battery Remaining Percentage: 52 %
Battery Voltage: 2.95 V
Brady Statistic AP VP Percent: 1 %
Brady Statistic AP VS Percent: 1 %
Brady Statistic AS VP Percent: 1 %
Brady Statistic AS VS Percent: 99 %
Brady Statistic RA Percent Paced: 1 %
Brady Statistic RV Percent Paced: 1 %
Date Time Interrogation Session: 20260205060844
HighPow Impedance: 73 Ohm
HighPow Impedance: 73 Ohm
Implantable Lead Connection Status: 753985
Implantable Lead Connection Status: 753985
Implantable Lead Implant Date: 20200804
Implantable Lead Implant Date: 20200804
Implantable Lead Location: 753859
Implantable Lead Location: 753860
Implantable Lead Model: 7122
Implantable Pulse Generator Implant Date: 20200804
Lead Channel Impedance Value: 540 Ohm
Lead Channel Impedance Value: 600 Ohm
Lead Channel Pacing Threshold Amplitude: 0.75 V
Lead Channel Pacing Threshold Amplitude: 0.75 V
Lead Channel Pacing Threshold Pulse Width: 0.5 ms
Lead Channel Pacing Threshold Pulse Width: 0.5 ms
Lead Channel Sensing Intrinsic Amplitude: 11.5 mV
Lead Channel Sensing Intrinsic Amplitude: 5 mV
Lead Channel Setting Pacing Amplitude: 2 V
Lead Channel Setting Pacing Amplitude: 2.5 V
Lead Channel Setting Pacing Pulse Width: 0.5 ms
Lead Channel Setting Sensing Sensitivity: 0.5 mV
Pulse Gen Serial Number: 9903314

## 2024-09-08 ENCOUNTER — Ambulatory Visit

## 2024-12-08 ENCOUNTER — Ambulatory Visit
